# Patient Record
Sex: Male | Born: 1960 | Race: Black or African American | Hispanic: No | Marital: Single | Smoking: Never smoker
Health system: Southern US, Community
[De-identification: ages and names within clinical notes are randomized; demographics above are authoritative.]

## PROBLEM LIST (undated history)

## (undated) DIAGNOSIS — G20 Parkinson's disease: Secondary | ICD-10-CM

## (undated) DIAGNOSIS — F329 Major depressive disorder, single episode, unspecified: Secondary | ICD-10-CM

## (undated) DIAGNOSIS — I10 Essential (primary) hypertension: Secondary | ICD-10-CM

## (undated) DIAGNOSIS — G20A1 Parkinson's disease without dyskinesia, without mention of fluctuations: Secondary | ICD-10-CM

## (undated) DIAGNOSIS — B20 Human immunodeficiency virus [HIV] disease: Secondary | ICD-10-CM

## (undated) DIAGNOSIS — Z21 Asymptomatic human immunodeficiency virus [HIV] infection status: Secondary | ICD-10-CM

## (undated) DIAGNOSIS — E079 Disorder of thyroid, unspecified: Secondary | ICD-10-CM

## (undated) DIAGNOSIS — F32A Depression, unspecified: Secondary | ICD-10-CM

## (undated) DIAGNOSIS — E119 Type 2 diabetes mellitus without complications: Secondary | ICD-10-CM

## (undated) DIAGNOSIS — E0590 Thyrotoxicosis, unspecified without thyrotoxic crisis or storm: Secondary | ICD-10-CM

## (undated) HISTORY — DX: Parkinson's disease: G20

## (undated) HISTORY — PX: THYROIDECTOMY: SHX17

## (undated) HISTORY — PX: TONSILLECTOMY: SUR1361

---
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
## 1980-05-02 HISTORY — PX: OTHER SURGICAL HISTORY: SHX169

## 2003-06-02 ENCOUNTER — Other Ambulatory Visit: Payer: Self-pay

## 2004-04-20 ENCOUNTER — Emergency Department: Payer: Self-pay | Admitting: Emergency Medicine

## 2006-01-06 ENCOUNTER — Emergency Department: Payer: Self-pay | Admitting: Emergency Medicine

## 2009-11-04 ENCOUNTER — Emergency Department: Payer: Self-pay | Admitting: Emergency Medicine

## 2010-12-05 ENCOUNTER — Emergency Department: Payer: Self-pay | Admitting: Emergency Medicine

## 2013-09-26 ENCOUNTER — Emergency Department: Payer: Self-pay | Admitting: Emergency Medicine

## 2016-01-18 ENCOUNTER — Encounter: Payer: Self-pay | Admitting: Emergency Medicine

## 2016-01-18 ENCOUNTER — Emergency Department
Admission: EM | Admit: 2016-01-18 | Discharge: 2016-01-18 | Disposition: A | Discharge status: Home / Self Care | Payer: Managed Care, Other (non HMO) | Attending: Emergency Medicine | Admitting: Emergency Medicine

## 2016-01-18 ENCOUNTER — Emergency Department: Payer: Managed Care, Other (non HMO)

## 2016-01-18 DIAGNOSIS — E119 Type 2 diabetes mellitus without complications: Secondary | ICD-10-CM | POA: Diagnosis not present

## 2016-01-18 DIAGNOSIS — N452 Orchitis: Secondary | ICD-10-CM | POA: Diagnosis not present

## 2016-01-18 DIAGNOSIS — N50819 Testicular pain, unspecified: Secondary | ICD-10-CM

## 2016-01-18 DIAGNOSIS — N50812 Left testicular pain: Secondary | ICD-10-CM | POA: Diagnosis present

## 2016-01-18 HISTORY — DX: Type 2 diabetes mellitus without complications: E11.9

## 2016-01-18 LAB — URINALYSIS COMPLETE WITH MICROSCOPIC (ARMC ONLY)
Bacteria, UA: NONE SEEN
Bilirubin Urine: NEGATIVE
Hgb urine dipstick: NEGATIVE
Leukocytes, UA: NEGATIVE
Nitrite: NEGATIVE
PROTEIN: 30 mg/dL — AB
Specific Gravity, Urine: 1.035 — ABNORMAL HIGH (ref 1.005–1.030)
pH: 6 (ref 5.0–8.0)

## 2016-01-18 MED ORDER — HYDROCODONE-ACETAMINOPHEN 5-325 MG PO TABS: 1.0000 | ORAL_TABLET | ORAL | 0 refills | Status: DC | PRN

## 2016-01-18 MED ORDER — LEVOFLOXACIN 500 MG PO TABS: 500.0000 mg | ORAL_TABLET | Freq: Every day | ORAL | 0 refills | Status: AC

## 2016-01-18 NOTE — ED Provider Notes (Signed)
[facility] Emergency Department Provider Note  Time seen: 8:11 AM  I have reviewed the triage vital signs and the nursing notes.   HISTORY  Chief Complaint Testicle Pain    HPI Christophe LouisOtis Pargas is a 55 y.o. male with a past medical history of diabetes who presents the emergency department left testicular pain. According to the patient for the past 2 days he has noticed intermittent pain in his left testicle. States he noted yesterday that the left testicle appeared very swollen. States continued swelling and pain today along with subjective fever at home so he came to the emergency department for evaluation. Patient denies any dysuria, hematuria. Denies any penile discharge or abdominal pain. Denies any nausea, vomiting, diarrhea or pain with bowel movements. Patient does state mild lower back pain, but is not sure if they are related. Describes the pain as a heavy feeling in the left testicle, moderate in severity, worse when he is standing or ambulating.  Past Medical History:  Diagnosis Date  . Diabetes mellitus without complication (HCC)     There are no active problems to display for this patient.   Past Surgical History:  Procedure Laterality Date  . THYROIDECTOMY    . TONSILLECTOMY      Prior to Admission medications   Not on File    Allergies  Allergen Reactions  . Septra [Sulfamethoxazole-Trimethoprim] Rash    No family history on file.  Social History Social History  Substance Use Topics  . Smoking status: Never Smoker  . Smokeless tobacco: Not on file  . Alcohol use No    Review of Systems Constitutional: Negative for fever. Cardiovascular: Negative for chest pain. Respiratory: Negative for shortness of breath. Gastrointestinal: Negative for abdominal pain, vomiting and diarrhea. Genitourinary: Negative for dysuria.Positive left testicular pain. Musculoskeletal: Mild lower back pain. 10-point ROS otherwise  negative.  ____________________________________________   PHYSICAL EXAM:  VITAL SIGNS: ED Triage Vitals [01/18/16 0603]  Enc Vitals Group     BP      Pulse      Resp      Temp      Temp src      SpO2      Weight 195 lb (88.5 kg)     Height 5\' 9"  (1.753 m)     Head Circumference      Peak Flow      Pain Score 9     Pain Loc      Pain Edu?      Excl. in GC?     Constitutional: Alert and oriented. Well appearing and in no distress. Eyes: Normal exam ENT   Head: Normocephalic and atraumatic.   Mouth/Throat: Mucous membranes are moist. Cardiovascular: Normal rate, regular rhythm. No murmur Respiratory: Normal respiratory effort without tachypnea nor retractions. Breath sounds are clear  Gastrointestinal: Soft and nontender. No distention.  Genitourinary: Patient has moderate swelling of the left scrotum, appears to have swelling above and below the left testicle with moderate tenderness to palpation. Normal right testicle. Normal penis. Musculoskeletal: Nontender with normal range of motion in all extremities.  Neurologic:  Normal speech and language. No gross focal neurologic deficits  Skin:  Skin is warm, dry and intact.  Psychiatric: Mood and affect are normal.   ____________________________________________   RADIOLOGY  Ultrasound shows small left hydrocele with possible degrees possibly infected.  ____________________________________________   INITIAL IMPRESSION / ASSESSMENT AND PLAN / ED COURSE  Pertinent labs & imaging results that were available during my  care of the patient were reviewed by me and considered in my medical decision making (see chart for details).  Patient presents the emergency department with 2 days of left testicular pain and swelling. Patient does have moderate swelling and tenderness to this area on exam. Otherwise normal physical exam. No skin color changes overlying the scrotum. We'll obtain an ultrasound, urinalysis and closely  monitor in the emergency department.  Overall the patient appears well. Ultrasound shows small hydrocele with possible debris, likely infected given the subjective fevers/chills at home with testicular pain. We will cover the patient with Levaquin and have the patient follow up with urology for further workup. Patient agreeable to plan.  ____________________________________________   FINAL CLINICAL IMPRESSION(S) / ED DIAGNOSES  Left testicular pain    Minna Antis, MD 01/18/16 [id number]

## 2016-01-18 NOTE — ED Triage Notes (Signed)
Patient to ED stating he thinks "he has a hernia." States his left testicle is hard, huge and painful. Denies urinary symptoms, states lower back is starting to hurt as well.

## 2016-01-18 NOTE — ED Notes (Signed)
Pt alert and oriented X4, active, cooperative, pt in NAD. RR even and unlabored, color WNL.  Pt informed to return if any life threatening symptoms occur.   

## 2016-01-18 NOTE — ED Notes (Signed)
Pt to US.

## 2018-07-08 ENCOUNTER — Other Ambulatory Visit: Payer: Self-pay

## 2018-07-08 ENCOUNTER — Emergency Department: Payer: No Typology Code available for payment source

## 2018-07-08 ENCOUNTER — Encounter: Payer: Self-pay | Admitting: Radiology

## 2018-07-08 ENCOUNTER — Emergency Department
Admission: EM | Admit: 2018-07-08 | Discharge: 2018-07-08 | Disposition: A | Discharge status: Home / Self Care | Payer: No Typology Code available for payment source | Attending: Emergency Medicine | Admitting: Emergency Medicine

## 2018-07-08 DIAGNOSIS — R51 Headache: Secondary | ICD-10-CM | POA: Diagnosis not present

## 2018-07-08 DIAGNOSIS — B20 Human immunodeficiency virus [HIV] disease: Secondary | ICD-10-CM | POA: Insufficient documentation

## 2018-07-08 DIAGNOSIS — E119 Type 2 diabetes mellitus without complications: Secondary | ICD-10-CM | POA: Insufficient documentation

## 2018-07-08 DIAGNOSIS — M7918 Myalgia, other site: Secondary | ICD-10-CM | POA: Diagnosis not present

## 2018-07-08 DIAGNOSIS — J0100 Acute maxillary sinusitis, unspecified: Secondary | ICD-10-CM | POA: Insufficient documentation

## 2018-07-08 DIAGNOSIS — J189 Pneumonia, unspecified organism: Secondary | ICD-10-CM | POA: Diagnosis not present

## 2018-07-08 DIAGNOSIS — J181 Lobar pneumonia, unspecified organism: Secondary | ICD-10-CM

## 2018-07-08 DIAGNOSIS — R509 Fever, unspecified: Secondary | ICD-10-CM | POA: Diagnosis present

## 2018-07-08 LAB — CBC
HCT: 38.2 % — ABNORMAL LOW (ref 39.0–52.0)
Hemoglobin: 13.2 g/dL (ref 13.0–17.0)
MCH: 32.5 pg (ref 26.0–34.0)
MCHC: 34.6 g/dL (ref 30.0–36.0)
MCV: 94.1 fL (ref 80.0–100.0)
Platelets: 120 10*3/uL — ABNORMAL LOW (ref 150–400)
RBC: 4.06 MIL/uL — ABNORMAL LOW (ref 4.22–5.81)
RDW: 12.6 % (ref 11.5–15.5)
WBC: 18.1 10*3/uL — ABNORMAL HIGH (ref 4.0–10.5)
nRBC: 0 % (ref 0.0–0.2)

## 2018-07-08 LAB — URINALYSIS, ROUTINE W REFLEX MICROSCOPIC
Bacteria, UA: NONE SEEN
Bilirubin Urine: NEGATIVE
Glucose, UA: NEGATIVE mg/dL
Hgb urine dipstick: NEGATIVE
Ketones, ur: NEGATIVE mg/dL
Leukocytes,Ua: NEGATIVE
Nitrite: NEGATIVE
Protein, ur: 30 mg/dL — AB
Specific Gravity, Urine: >1.046 — ABNORMAL HIGH (ref 1.005–1.030)
pH: 5 (ref 5.0–8.0)

## 2018-07-08 LAB — BASIC METABOLIC PANEL
Anion gap: 4 — ABNORMAL LOW (ref 5–15)
BUN: 27 mg/dL — ABNORMAL HIGH (ref 6–20)
CO2: 27 mmol/L (ref 22–32)
CREATININE: 1.68 mg/dL — AB (ref 0.61–1.24)
Calcium: 8.4 mg/dL — ABNORMAL LOW (ref 8.9–10.3)
Chloride: 104 mmol/L (ref 98–111)
GFR calc Af Amer: 51 mL/min — ABNORMAL LOW (ref >60)
GFR calc non Af Amer: 44 mL/min — ABNORMAL LOW (ref >60)
GLUCOSE: 140 mg/dL — AB (ref 70–99)
Potassium: 4 mmol/L (ref 3.5–5.1)
Sodium: 135 mmol/L (ref 135–145)

## 2018-07-08 LAB — INFLUENZA PANEL BY PCR (TYPE A & B)
INFLAPCR: NEGATIVE
Influenza B By PCR: NEGATIVE

## 2018-07-08 LAB — GLUCOSE, CAPILLARY: Glucose-Capillary: 126 mg/dL — ABNORMAL HIGH (ref 70–99)

## 2018-07-08 LAB — LACTIC ACID, PLASMA: Lactic Acid, Venous: 1.8 mmol/L (ref 0.5–1.9)

## 2018-07-08 MED ORDER — IBUPROFEN 800 MG PO TABS
800.0000 mg | ORAL_TABLET | Freq: Once | ORAL | Status: AC
  Administered 2018-07-08: 800 mg via ORAL
  Filled 2018-07-08: qty 1

## 2018-07-08 MED ORDER — AMOXICILLIN-POT CLAVULANATE 875-125 MG PO TABS
1.0000 | ORAL_TABLET | Freq: Once | ORAL | Status: AC
  Administered 2018-07-08: 1 via ORAL
  Filled 2018-07-08: qty 1

## 2018-07-08 MED ORDER — IOHEXOL 300 MG/ML  SOLN
75.0000 mL | Freq: Once | INTRAMUSCULAR | Status: AC | PRN
  Administered 2018-07-08: 75 mL via INTRAVENOUS
  Filled 2018-07-08: qty 75

## 2018-07-08 MED ORDER — SODIUM CHLORIDE 0.9 % IV BOLUS
1000.0000 mL | Freq: Once | INTRAVENOUS | Status: AC
  Administered 2018-07-08: 1000 mL via INTRAVENOUS

## 2018-07-08 MED ORDER — ACETAMINOPHEN 500 MG PO TABS
1000.0000 mg | ORAL_TABLET | Freq: Once | ORAL | Status: AC
  Administered 2018-07-08: 1000 mg via ORAL
  Filled 2018-07-08: qty 2

## 2018-07-08 MED ORDER — AZITHROMYCIN 250 MG PO TABS: ORAL_TABLET | ORAL | 0 refills | Status: DC

## 2018-07-08 MED ORDER — AZITHROMYCIN 500 MG PO TABS
500.0000 mg | ORAL_TABLET | Freq: Once | ORAL | Status: AC
  Administered 2018-07-08: 500 mg via ORAL
  Filled 2018-07-08: qty 1

## 2018-07-08 MED ORDER — SODIUM CHLORIDE 0.9 % IV SOLN
1.0000 g | Freq: Once | INTRAVENOUS | Status: AC
  Administered 2018-07-08: 1 g via INTRAVENOUS
  Filled 2018-07-08: qty 10

## 2018-07-08 MED ORDER — AMOXICILLIN-POT CLAVULANATE 875-125 MG PO TABS: 1.0000 | ORAL_TABLET | Freq: Two times a day (BID) | ORAL | 0 refills | Status: AC

## 2018-07-08 NOTE — ED Notes (Signed)
Left AC IV came out and was d/c. New IV started with set of blood cultures.

## 2018-07-08 NOTE — Discharge Instructions (Signed)
Please take antibiotics as prescribed.  Please take Tylenol as needed for fevers.  Make sure drinking lots of fluids.  If any shortness of breath, fevers above 102.1, chest pain, return to the emergency department immediately.

## 2018-07-08 NOTE — ED Notes (Signed)
Pt noted to be clammy, discussion about pt's temp being lower vs blood sugar dropping, pt states that he didn't think it was his blood sugar because he wouldn't be talking to me if it were low, fsbs performed at 126

## 2018-07-08 NOTE — ED Provider Notes (Signed)
[facility] EMERGENCY DEPARTMENT Provider Note   CSN: [id number] Arrival date & time: 07/08/18  1246    History   Chief Complaint Chief Complaint  Patient presents with  . Fever  . Facial Pain  . Generalized Body Aches    HPI Jeffrey Pearson is a 58 y.o. male.  With a history of HIV and diabetes who is followed by [facility] presents to the emergency department for evaluation of fever, facial pain and body aches.  Patient states around 4 weeks ago he developed cold symptoms consisting of cough congestion runny nose.  2 weeks ago his cough resolved but he continued to have soreness congestion and sinus pressure.  Sinus pain and pressure is 8 out of 10.  This morning he noticed a fever upon awakening and took Tylenol.  He has not taken ibuprofen today.  He denies any cough but is having moderate maxillary facial pain.  No sore throat.  No headache, neck pain, chest pain or shortness of breath.  No abdominal pain nausea vomiting or diarrhea.  He does admit to having generalized body aches.  Patient denies any recent travel.     HPI  Past Medical History:  Diagnosis Date  . Diabetes mellitus without complication (HCC)     There are no active problems to display for this patient.   Past Surgical History:  Procedure Laterality Date  . THYROIDECTOMY    . TONSILLECTOMY          Home Medications    Prior to Admission medications   Medication Sig Start Date End Date Taking? Authorizing Provider  HYDROcodone-acetaminophen (NORCO/VICODIN) 5-325 MG tablet Take 1 tablet by mouth every 4 (four) hours as needed. 01/18/16   Minna Antis, MD    Family History No family history on file.  Social History Social History   Tobacco Use  . Smoking status: Never Smoker  Substance Use Topics  . Alcohol use: No  . Drug use: Not on file     Allergies   Septra [sulfamethoxazole-trimethoprim]   Review of Systems Review of Systems  Constitutional: Negative for  fever.  HENT: Positive for congestion, rhinorrhea, sinus pressure, sinus pain and sore throat.   Respiratory: Negative for cough, wheezing and stridor.   Gastrointestinal: Negative for diarrhea, nausea and vomiting.  Musculoskeletal: Positive for myalgias. Negative for arthralgias and neck stiffness.  Skin: Negative for rash and wound.  Neurological: Negative for dizziness.     Physical Exam Updated Vital Signs BP 100/61 (BP Location: Right Arm)   Pulse 87   Temp 98 F (36.7 C) (Oral)   Resp 16   Ht  (1.753 m)   Wt 98.9 kg   SpO2 100%   BMI 32.19 kg/m   Physical Exam Constitutional:      General: He is not in acute distress.    Appearance: He is well-developed.  HENT:     Head: Normocephalic and atraumatic.     Jaw: No trismus.     Comments: Mild bilateral maxillary and frontal sinus tenderness to palpation.    Right Ear: Hearing, ear canal and external ear normal. There is impacted cerumen.     Left Ear: Hearing, tympanic membrane, ear canal and external ear normal. There is no impacted cerumen.     Nose: Rhinorrhea present.     Right Sinus: No maxillary sinus tenderness or frontal sinus tenderness.     Left Sinus: No maxillary sinus tenderness or frontal sinus tenderness.     Mouth/Throat:  Pharynx: No oropharyngeal exudate, posterior oropharyngeal erythema or uvula swelling.     Tonsils: No tonsillar abscesses.  Eyes:     Conjunctiva/sclera: Conjunctivae normal.  Neck:     Musculoskeletal: Normal range of motion.  Cardiovascular:     Rate and Rhythm: Regular rhythm. Tachycardia present.     Pulses: Normal pulses.  Pulmonary:     Effort: Pulmonary effort is normal. No respiratory distress.     Breath sounds: Normal breath sounds. No stridor. No wheezing or rales.  Chest:     Chest wall: No tenderness.  Abdominal:     General: There is no distension.     Palpations: Abdomen is soft.     Tenderness: There is no abdominal tenderness. There is no guarding.    Musculoskeletal: Normal range of motion.        General: No swelling.  Skin:    General: Skin is warm and dry.     Findings: No rash.  Neurological:     Mental Status: He is alert and oriented to person, place, and time.  Psychiatric:        Behavior: Behavior normal.        Thought Content: Thought content normal.        Judgment: Judgment normal.      ED Treatments / Results  Labs (all labs ordered are listed, but only abnormal results are displayed) Labs Reviewed  CBC - Abnormal; Notable for the following components:      Result Value   WBC 18.1 (*)    RBC 4.06 (*)    HCT 38.2 (*)    Platelets 120 (*)    All other components within normal limits  BASIC METABOLIC PANEL - Abnormal; Notable for the following components:   Glucose, Bld 140 (*)    BUN 27 (*)    Creatinine, Ser 1.68 (*)    Calcium 8.4 (*)    GFR calc non Af Amer 44 (*)    GFR calc Af Amer 51 (*)    Anion gap 4 (*)    All other components within normal limits  GLUCOSE, CAPILLARY - Abnormal; Notable for the following components:   Glucose-Capillary 126 (*)    All other components within normal limits  URINALYSIS, ROUTINE W REFLEX MICROSCOPIC - Abnormal; Notable for the following components:   Color, Urine YELLOW (*)    APPearance CLEAR (*)    Specific Gravity, Urine >1.046 (*)    Protein, ur 30 (*)    All other components within normal limits  CULTURE, BLOOD (ROUTINE X 2)  CULTURE, BLOOD (ROUTINE X 2)  INFLUENZA PANEL BY PCR (TYPE A & B)  LACTIC ACID, PLASMA    EKG None  Radiology Dg Chest 2 View  Result Date: 07/08/2018 CLINICAL DATA:  Fever EXAM: CHEST - 2 VIEW COMPARISON:  12/05/2010 chest radiograph. FINDINGS: Surgical clips overlie the medial lower neck bilaterally. Stable cardiomediastinal silhouette with normal heart size. No pneumothorax. No pleural effusion. Masslike 6 cm focus of consolidation in the right upper lung is new. IMPRESSION: New masslike 6 cm focus of consolidation in the  right upper lung. Differential includes lung neoplasm or round pneumonia. Recommend further characterization with chest CT with IV contrast. Electronically Signed   By: Delbert Phenix M.D.   On: 07/08/2018 14:34   Ct Chest W Contrast  Result Date: 07/08/2018 CLINICAL DATA:  Rounded opacity in the right upper lobe seen on chest x-ray from earlier today could represent neoplasm versus pneumonia. CT scan  was recommended. EXAM: CT CHEST WITH CONTRAST TECHNIQUE: Multidetector CT imaging of the chest was performed during intravenous contrast administration. CONTRAST:  75mL OMNIPAQUE IOHEXOL 300 MG/ML  SOLN COMPARISON:  Chest x-ray July 08, 2018 FINDINGS: Cardiovascular: Atherosclerotic changes are seen in the nonaneurysmal aorta. Central pulmonary arteries are normal. Coronary artery calcifications involve the right and left coronary arteries. The heart size borderline. Mediastinum/Nodes: There is a high attenuation mass measuring 15 x 9 mm in the anterior mediastinum on series 2, image 31. No other suspicious masses in the mediastinum. No effusions. Surgical clips in the thyroid bed consistent with history of thyroidectomy. There does appear to be some residual thyroid tissue. Mildly prominent axillary nodes may be reactive. Lungs/Pleura: Central airways are normal. No pneumothorax. There is rounded opacity in the posterior right upper lobe with air bronchograms. The opacity measures 6.4 by 5.6 by 5.7 cm. No other nodules, masses, or focal infiltrates identified. Upper Abdomen: No acute abnormality. Musculoskeletal: Anterior wedging of 2 or 3 midthoracic vertebral bodies. Mild degenerative changes. IMPRESSION: 1. Rounded opacity in the right upper lobe favored to represent pneumonia. Neoplasm considered less likely but not completely excluded. Recommend treatment with antibiotics and a follow-up chest CT in 6-8 weeks to ensure resolution. 2. 15 x 9 mm high attenuation mass in the anterior mediastinum may represent a  hyperenhancing lymph node. Thyroid tissue is a possibility as is a nodule of thymic origin. Recommend attention on follow-up. 3. Atherosclerotic changes in the nonaneurysmal aorta. 4. Coronary artery calcifications. 5. Mildly prominent bilateral axillary nodes, likely reactive. Recommend attention on follow-up. Aortic Atherosclerosis (ICD10-I70.0). Electronically Signed   By: Gerome Sam III M.D   On: 07/08/2018 17:20    Procedures Procedures (including critical care time)  Medications Ordered in ED Medications  cefTRIAXone (ROCEPHIN) 1 g in sodium chloride 0.9 % 100 mL IVPB (1 g Intravenous New Bag/Given 07/08/18 1840)  ibuprofen (ADVIL,MOTRIN) tablet 800 mg (800 mg Oral Given 07/08/18 1414)  acetaminophen (TYLENOL) tablet 1,000 mg (1,000 mg Oral Given 07/08/18 1546)  iohexol (OMNIPAQUE) 300 MG/ML solution 75 mL (75 mLs Intravenous Contrast Given 07/08/18 1628)  sodium chloride 0.9 % bolus 1,000 mL (1,000 mLs Intravenous Bolus 07/08/18 1838)     Initial Impression / Assessment and Plan / ED Course  I have reviewed the triage vital signs and the nursing notes.  Pertinent labs & imaging results that were available during my care of the patient were reviewed by me and considered in my medical decision making (see chart for details).        58 year old male with history of HIV and diabetes mellitus presents to the emergency department for evaluation of fever.  Has had cold symptoms for 4 weeks.  Mostly complaining of maxillary sinus pain and pressure concerning for sinusitis.  Chest x-ray showed right upper lobe consolidation.  CT of chest with contrast ordered and showed large opacity in the right upper lobe concerning most likely for pneumonia.  Patient given Tylenol and ibuprofen, vital signs improved, afebrile.  Patient given 1 g of ceftriaxone IV.  Patient's lactic acid within normal limits.  He was given IV fluids.  CBC and BMP showed leukocytosis of 18.1.  Patient's vital signs normal at time  of discharge.  He was afebrile with normal BP, temp heart rate and O2 saturations.  Patient appears well in no distress.  He has follow-up appointment this week on Thursday, 07/12/2018 with PCP at [facility].  He understands signs symptoms return to the ED for.  Patient stable and ready for discharge home.  Final Clinical Impressions(s) / ED Diagnoses   Final diagnoses:  Fever, unspecified fever cause  Acute non-recurrent maxillary sinusitis  Pneumonia of right upper lobe due to infectious organism [facility])    ED Discharge Orders    None       Ronnette Juniper 07/08/18 Deatra Robinson, MD 07/09/18 [phone]

## 2018-07-08 NOTE — ED Notes (Signed)
Pt reports fever, chills, and fatigue that started this week. Pt reports hx/o chronic sinus infections. Pt states that his temperature this morning was 102. Pt took tylenol around 0600

## 2018-07-08 NOTE — ED Notes (Addendum)
Mother called. Will pass on message.

## 2018-07-08 NOTE — ED Triage Notes (Signed)
Pt states that he hasn't felt well for 3-4 weeks with a cough and congestion, but states since Thursday he has had a fever, states facial pain, headache, and body aches. States that he thinks he has a sinus infection

## 2018-07-13 LAB — CULTURE, BLOOD (ROUTINE X 2)
Culture: NO GROWTH
Culture: NO GROWTH
Special Requests: ADEQUATE

## 2018-08-23 ENCOUNTER — Emergency Department: Payer: No Typology Code available for payment source

## 2018-08-23 ENCOUNTER — Encounter: Payer: Self-pay | Admitting: Intensive Care

## 2018-08-23 ENCOUNTER — Other Ambulatory Visit: Payer: Self-pay

## 2018-08-23 ENCOUNTER — Inpatient Hospital Stay
Admission: EM | Admit: 2018-08-23 | Discharge: 2018-08-25 | DRG: 638 | Discharge status: Against Medical Advice | Payer: No Typology Code available for payment source | Attending: Internal Medicine | Admitting: Internal Medicine

## 2018-08-23 DIAGNOSIS — E890 Postprocedural hypothyroidism: Secondary | ICD-10-CM | POA: Diagnosis present

## 2018-08-23 DIAGNOSIS — Z9114 Patient's other noncompliance with medication regimen: Secondary | ICD-10-CM | POA: Diagnosis not present

## 2018-08-23 DIAGNOSIS — Z888 Allergy status to other drugs, medicaments and biological substances status: Secondary | ICD-10-CM

## 2018-08-23 DIAGNOSIS — Z21 Asymptomatic human immunodeficiency virus [HIV] infection status: Secondary | ICD-10-CM | POA: Diagnosis present

## 2018-08-23 DIAGNOSIS — E0590 Thyrotoxicosis, unspecified without thyrotoxic crisis or storm: Secondary | ICD-10-CM | POA: Diagnosis present

## 2018-08-23 DIAGNOSIS — I10 Essential (primary) hypertension: Secondary | ICD-10-CM | POA: Diagnosis present

## 2018-08-23 DIAGNOSIS — R001 Bradycardia, unspecified: Secondary | ICD-10-CM | POA: Diagnosis present

## 2018-08-23 DIAGNOSIS — I442 Atrioventricular block, complete: Secondary | ICD-10-CM | POA: Diagnosis present

## 2018-08-23 DIAGNOSIS — E860 Dehydration: Secondary | ICD-10-CM | POA: Diagnosis present

## 2018-08-23 DIAGNOSIS — G9340 Encephalopathy, unspecified: Secondary | ICD-10-CM | POA: Diagnosis present

## 2018-08-23 DIAGNOSIS — Z8249 Family history of ischemic heart disease and other diseases of the circulatory system: Secondary | ICD-10-CM | POA: Diagnosis not present

## 2018-08-23 DIAGNOSIS — R740 Nonspecific elevation of levels of transaminase and lactic acid dehydrogenase [LDH]: Secondary | ICD-10-CM | POA: Diagnosis present

## 2018-08-23 DIAGNOSIS — N179 Acute kidney failure, unspecified: Secondary | ICD-10-CM | POA: Diagnosis present

## 2018-08-23 DIAGNOSIS — E1101 Type 2 diabetes mellitus with hyperosmolarity with coma: Secondary | ICD-10-CM | POA: Diagnosis present

## 2018-08-23 DIAGNOSIS — E1065 Type 1 diabetes mellitus with hyperglycemia: Principal | ICD-10-CM | POA: Diagnosis present

## 2018-08-23 DIAGNOSIS — Z79891 Long term (current) use of opiate analgesic: Secondary | ICD-10-CM | POA: Diagnosis not present

## 2018-08-23 DIAGNOSIS — E785 Hyperlipidemia, unspecified: Secondary | ICD-10-CM | POA: Diagnosis present

## 2018-08-23 DIAGNOSIS — R739 Hyperglycemia, unspecified: Secondary | ICD-10-CM

## 2018-08-23 DIAGNOSIS — R569 Unspecified convulsions: Secondary | ICD-10-CM | POA: Diagnosis present

## 2018-08-23 HISTORY — DX: Disorder of thyroid, unspecified: E07.9

## 2018-08-23 HISTORY — DX: Thyrotoxicosis, unspecified without thyrotoxic crisis or storm: E05.90

## 2018-08-23 HISTORY — DX: Asymptomatic human immunodeficiency virus (hiv) infection status: Z21

## 2018-08-23 HISTORY — DX: Essential (primary) hypertension: I10

## 2018-08-23 HISTORY — DX: Human immunodeficiency virus (HIV) disease: B20

## 2018-08-23 HISTORY — DX: Major depressive disorder, single episode, unspecified: F32.9

## 2018-08-23 HISTORY — DX: Depression, unspecified: F32.A

## 2018-08-23 LAB — URINALYSIS, COMPLETE (UACMP) WITH MICROSCOPIC
Bacteria, UA: NONE SEEN
Bilirubin Urine: NEGATIVE
Glucose, UA: >=500 mg/dL — AB
Hgb urine dipstick: NEGATIVE
Ketones, ur: NEGATIVE mg/dL
Leukocytes,Ua: NEGATIVE
Nitrite: NEGATIVE
Protein, ur: NEGATIVE mg/dL
Specific Gravity, Urine: 1.021 (ref 1.005–1.030)
Squamous Epithelial / HPF: NONE SEEN (ref 0–5)
pH: 7 (ref 5.0–8.0)

## 2018-08-23 LAB — COMPREHENSIVE METABOLIC PANEL
ALT: 62 U/L — ABNORMAL HIGH (ref 0–44)
AST: 57 U/L — ABNORMAL HIGH (ref 15–41)
Albumin: 4.1 g/dL (ref 3.5–5.0)
Alkaline Phosphatase: 183 U/L — ABNORMAL HIGH (ref 38–126)
Anion gap: 14 (ref 5–15)
BUN: 21 mg/dL — ABNORMAL HIGH (ref 6–20)
CO2: 23 mmol/L (ref 22–32)
Calcium: 8.9 mg/dL (ref 8.9–10.3)
Chloride: 85 mmol/L — ABNORMAL LOW (ref 98–111)
Creatinine, Ser: 1.27 mg/dL — ABNORMAL HIGH (ref 0.61–1.24)
GFR calc Af Amer: >60 mL/min (ref >60)
GFR calc non Af Amer: >60 mL/min (ref >60)
Glucose, Bld: 788 mg/dL (ref 70–99)
Potassium: 5.2 mmol/L — ABNORMAL HIGH (ref 3.5–5.1)
Sodium: 122 mmol/L — ABNORMAL LOW (ref 135–145)
Total Bilirubin: 0.8 mg/dL (ref 0.3–1.2)
Total Protein: 8 g/dL (ref 6.5–8.1)

## 2018-08-23 LAB — CBC WITH DIFFERENTIAL/PLATELET
Abs Immature Granulocytes: 0.01 10*3/uL (ref 0.00–0.07)
Basophils Absolute: 0 10*3/uL (ref 0.0–0.1)
Basophils Relative: 0 %
Eosinophils Absolute: 0 10*3/uL (ref 0.0–0.5)
Eosinophils Relative: 1 %
HCT: 38.1 % — ABNORMAL LOW (ref 39.0–52.0)
Hemoglobin: 13.2 g/dL (ref 13.0–17.0)
Immature Granulocytes: 0 %
Lymphocytes Relative: 12 %
Lymphs Abs: 0.6 10*3/uL — ABNORMAL LOW (ref 0.7–4.0)
MCH: 32.3 pg (ref 26.0–34.0)
MCHC: 34.6 g/dL (ref 30.0–36.0)
MCV: 93.2 fL (ref 80.0–100.0)
Monocytes Absolute: 0.2 10*3/uL (ref 0.1–1.0)
Monocytes Relative: 3 %
Neutro Abs: 3.9 10*3/uL (ref 1.7–7.7)
Neutrophils Relative %: 84 %
Platelets: 113 10*3/uL — ABNORMAL LOW (ref 150–400)
RBC: 4.09 MIL/uL — ABNORMAL LOW (ref 4.22–5.81)
RDW: 11.9 % (ref 11.5–15.5)
WBC: 4.7 10*3/uL (ref 4.0–10.5)
nRBC: 0 % (ref 0.0–0.2)

## 2018-08-23 LAB — GLUCOSE, CAPILLARY
Glucose-Capillary: 392 mg/dL — ABNORMAL HIGH (ref 70–99)
Glucose-Capillary: 488 mg/dL — ABNORMAL HIGH (ref 70–99)
Glucose-Capillary: 533 mg/dL (ref 70–99)

## 2018-08-23 LAB — MAGNESIUM: Magnesium: 1.9 mg/dL (ref 1.7–2.4)

## 2018-08-23 LAB — OSMOLALITY: Osmolality: 304 mOsm/kg — ABNORMAL HIGH (ref 275–295)

## 2018-08-23 LAB — MRSA PCR SCREENING: MRSA by PCR: NEGATIVE

## 2018-08-23 LAB — TROPONIN I: Troponin I: <0.03 ng/mL (ref <0.03)

## 2018-08-23 MED ORDER — ATORVASTATIN CALCIUM 10 MG PO TABS
10.0000 mg | ORAL_TABLET | Freq: Every day | ORAL | Status: DC
  Administered 2018-08-24: 10 mg via ORAL
  Filled 2018-08-23: qty 1

## 2018-08-23 MED ORDER — INSULIN REGULAR(HUMAN) IN NACL 100-0.9 UT/100ML-% IV SOLN: INTRAVENOUS | Status: DC

## 2018-08-23 MED ORDER — SODIUM CHLORIDE 0.9 % IV BOLUS
1000.0000 mL | Freq: Once | INTRAVENOUS | Status: AC
  Administered 2018-08-23: 1000 mL via INTRAVENOUS

## 2018-08-23 MED ORDER — ATORVASTATIN CALCIUM 10 MG PO TABS: 10.0000 mg | ORAL_TABLET | Freq: Every day | ORAL | Status: DC

## 2018-08-23 MED ORDER — IOHEXOL 300 MG/ML  SOLN
75.0000 mL | Freq: Once | INTRAMUSCULAR | Status: AC | PRN
  Administered 2018-08-23: 19:00:00 75 mL via INTRAVENOUS

## 2018-08-23 MED ORDER — DEXTROSE-NACL 5-0.45 % IV SOLN: INTRAVENOUS | Status: DC

## 2018-08-23 MED ORDER — ENOXAPARIN SODIUM 40 MG/0.4ML ~~LOC~~ SOLN
40.0000 mg | SUBCUTANEOUS | Status: DC
  Administered 2018-08-23 – 2018-08-24 (×2): 40 mg via SUBCUTANEOUS
  Filled 2018-08-23 (×2): qty 0.4

## 2018-08-23 MED ORDER — ACETAMINOPHEN 500 MG PO TABS
1000.0000 mg | ORAL_TABLET | Freq: Once | ORAL | Status: DC
  Filled 2018-08-23: qty 2

## 2018-08-23 MED ORDER — DULOXETINE HCL 30 MG PO CPEP
30.0000 mg | ORAL_CAPSULE | Freq: Every day | ORAL | Status: DC
  Administered 2018-08-24 – 2018-08-25 (×2): 30 mg via ORAL
  Filled 2018-08-23 (×2): qty 1

## 2018-08-23 MED ORDER — INSULIN REGULAR(HUMAN) IN NACL 100-0.9 UT/100ML-% IV SOLN
INTRAVENOUS | Status: DC
  Administered 2018-08-23: 3.3 [IU]/h via INTRAVENOUS
  Administered 2018-08-23: 23:00:00 4.7 [IU]/h via INTRAVENOUS
  Filled 2018-08-23: qty 100

## 2018-08-23 MED ORDER — VALACYCLOVIR HCL 500 MG PO TABS
500.0000 mg | ORAL_TABLET | Freq: Every day | ORAL | Status: DC
  Administered 2018-08-24 – 2018-08-25 (×2): 500 mg via ORAL
  Filled 2018-08-23 (×2): qty 1

## 2018-08-23 MED ORDER — LORAZEPAM 2 MG/ML IJ SOLN: 1.0000 mg | INTRAMUSCULAR | Status: DC | PRN

## 2018-08-23 MED ORDER — DEXTROSE-NACL 5-0.45 % IV SOLN
INTRAVENOUS | Status: DC
  Administered 2018-08-24: 03:00:00 via INTRAVENOUS

## 2018-08-23 MED ORDER — PROCHLORPERAZINE EDISYLATE 10 MG/2ML IJ SOLN
10.0000 mg | Freq: Once | INTRAMUSCULAR | Status: DC
  Filled 2018-08-23: qty 2

## 2018-08-23 MED ORDER — LORAZEPAM 2 MG/ML IJ SOLN
1.0000 mg | Freq: Once | INTRAMUSCULAR | Status: AC
  Administered 2018-08-23: 1 mg via INTRAVENOUS

## 2018-08-23 MED ORDER — BICTEGRAVIR-EMTRICITAB-TENOFOV 50-200-25 MG PO TABS
1.0000 | ORAL_TABLET | Freq: Every day | ORAL | Status: DC
  Administered 2018-08-24 – 2018-08-25 (×2): 1 via ORAL
  Filled 2018-08-23 (×2): qty 1

## 2018-08-23 MED ORDER — HYDRALAZINE HCL 20 MG/ML IJ SOLN: 5.0000 mg | INTRAMUSCULAR | Status: DC | PRN

## 2018-08-23 MED ORDER — LEVOTHYROXINE SODIUM 50 MCG PO TABS
75.0000 ug | ORAL_TABLET | Freq: Every day | ORAL | Status: DC
  Administered 2018-08-24 – 2018-08-25 (×2): 75 ug via ORAL
  Filled 2018-08-23: qty 2
  Filled 2018-08-23: qty 1

## 2018-08-23 MED ORDER — LORAZEPAM 2 MG/ML IJ SOLN
INTRAMUSCULAR | Status: AC
  Filled 2018-08-23: qty 1

## 2018-08-23 MED ORDER — SODIUM CHLORIDE 0.9 % IV SOLN
INTRAVENOUS | Status: AC
  Administered 2018-08-23 (×2): via INTRAVENOUS

## 2018-08-23 MED ORDER — SODIUM CHLORIDE 0.9 % IV SOLN
INTRAVENOUS | Status: DC
  Administered 2018-08-24: 01:00:00 via INTRAVENOUS

## 2018-08-23 MED ORDER — DIPHENHYDRAMINE HCL 50 MG/ML IJ SOLN
12.5000 mg | Freq: Once | INTRAMUSCULAR | Status: DC
  Filled 2018-08-23: qty 1

## 2018-08-23 NOTE — ED Notes (Signed)
sats decreased to 88 after ativan, placed on 2 L . Dr Roxan Hockey aware.

## 2018-08-23 NOTE — ED Notes (Signed)
Pt given his cell phone to update his family. Pt seems to fall asleep while using phone. When pt awaken, pt calls family and updated them.

## 2018-08-23 NOTE — ED Notes (Signed)
Pt brady down to low 20s. Still responsive. Rhythm strip placed in chart. Pads placed on pt. Dr Roxan Hockey aware. Pt returned to [facility].

## 2018-08-23 NOTE — ED Notes (Signed)
Dr Roxan Hockey at bedside. Pt having tremors and parts of body having mild shaking but pt still responding and following commands/answering questions.

## 2018-08-23 NOTE — H&P (Addendum)
[facility] [place] at [facility]   PATIENT NAME: Jeffrey Pearson    MR#:  [id number]  DATE OF BIRTH:  Mar 29, 1961  DATE OF ADMISSION:  08/23/2018  PRIMARY CARE PHYSICIAN: Patient, No Pcp Per   REQUESTING/REFERRING PHYSICIAN: Willy Eddy, MD  CHIEF COMPLAINT:   Chief Complaint  Patient presents with  . Migraine  . Weakness  . Torticollis    HISTORY OF PRESENT ILLNESS:  Jeffrey Pearson  is a 58 y.o. male with a known history of type 2 diabetes, HIV, hypothyroidism s/p thyroidectomy who presented to the ED with headaches and generalized weakness over the last 1 to 2 weeks.  He states he "just has not been feeling like himself".  He has been taking his insulin as prescribed.  He has not checked his blood sugar in 2 to 3 weeks.  While in triage, he was noted to have an episode where his eyes were moving back and forth.  This episode lasted about 30 seconds.  He was also noted to possibly have some movement of his head to the right side.  On arrival to the ED, he was noted to be bradycardic to the low 20s, likely in complete heart block.  He then reverted back to normal sinus rhythm spontaneously without any intervention.  Patient states he often becomes a little disoriented with slurred speech whenever his blood sugars got really low.  After his initial episode of bradycardia, heart rates were in the 80s to 90s.  Abs were significant for glucose 788, creatinine 1.27, normal anion gap.  UA was unremarkable.  Chest x-ray is unremarkable.  CT head with and without contrast were unremarkable.  Patient was started on insulin drip.  Hospitalists were called for admission.  PAST MEDICAL HISTORY:   Past Medical History:  Diagnosis Date  . Diabetes mellitus without complication (HCC)   . HIV (human immunodeficiency virus infection) (HCC)   . Hyperthyroidism   . Thyroid disease     PAST SURGICAL HISTORY:   Past Surgical History:  Procedure Laterality Date  .  THYROIDECTOMY    . TONSILLECTOMY      SOCIAL HISTORY:   Social History   Tobacco Use  . Smoking status: Never Smoker  . Smokeless tobacco: Never Used  Substance Use Topics  . Alcohol use: No    FAMILY HISTORY:  Maternal grandfather-heart disease  DRUG ALLERGIES:   Allergies  Allergen Reactions  . Septra [Sulfamethoxazole-Trimethoprim] Rash    REVIEW OF SYSTEMS:   Review of Systems  Constitutional: Positive for malaise/fatigue. Negative for chills and fever.  HENT: Negative for congestion and sore throat.   Eyes: Negative for blurred vision and double vision.  Respiratory: Negative for cough and shortness of breath.   Cardiovascular: Negative for chest pain and palpitations.  Gastrointestinal: Negative for nausea and vomiting.  Genitourinary: Negative for dysuria and urgency.  Musculoskeletal: Negative for back pain and neck pain.  Neurological: Positive for weakness. Negative for dizziness and headaches.  Psychiatric/Behavioral: Negative for depression. The patient is not nervous/anxious.     MEDICATIONS AT HOME:   Prior to Admission medications   Medication Sig Start Date End Date Taking? Authorizing Provider  azithromycin (ZITHROMAX Z-PAK) 250 MG tablet Take 2 tablets (500 mg) on  Day 1 and day 2,  followed by 1 tablet (250 mg) once daily on Days 3 through 7. 07/08/18   Evon Slack, PA-C  HYDROcodone-acetaminophen (NORCO/VICODIN) 5-325 MG tablet Take 1 tablet by mouth every 4 (four) hours  as needed. 01/18/16   Minna Antis, MD      VITAL SIGNS:  Blood pressure (!) 148/110, pulse 80, temperature 99.3 F (37.4 C), temperature source Oral, resp. rate (!) 26, height 5' 9.5" (1.765 m), weight 95.3 kg, SpO2 99 %.  PHYSICAL EXAMINATION:  Physical Exam  GENERAL:  58 y.o.-year-old patient lying in the bed with no acute distress.  EYES: Pupils equal, round, reactive to light and accommodation. No scleral icterus. Extraocular muscles intact.  HEENT: Head  atraumatic, normocephalic. Oropharynx and nasopharynx clear. + Dry mucous membranes NECK:  Supple, no jugular venous distention. No thyroid enlargement, no tenderness. +well-healed horizontal surgical incision on the anterior neck LUNGS: Normal breath sounds bilaterally, no wheezing, rales,rhonchi or crepitation. No use of accessory muscles of respiration.  CARDIOVASCULAR: RRR, S1, S2 normal. No rubs, or gallops. II/VI systolic murmur ABDOMEN: Soft, nontender, nondistended. Bowel sounds present. No organomegaly or mass.  EXTREMITIES: No pedal edema, cyanosis, or clubbing.  NEUROLOGIC: Cranial nerves II through XII are intact. Muscle strength 5/5 in all extremities. Sensation intact. Gait not checked.  PSYCHIATRIC: The patient is alert and oriented x 3.  SKIN: No obvious rash, lesion, or ulcer.   LABORATORY PANEL:   CBC Recent Labs  Lab 08/23/18 1829  WBC 4.7  HGB 13.2  HCT 38.1*  PLT 113*   ------------------------------------------------------------------------------------------------------------------  Chemistries  Recent Labs  Lab 08/23/18 1829 08/23/18 1833  NA 122*  --   K 5.2*  --   CL 85*  --   CO2 23  --   GLUCOSE 788*  --   BUN 21*  --   CREATININE 1.27*  --   CALCIUM 8.9  --   MG  --  1.9  AST 57*  --   ALT 62*  --   ALKPHOS 183*  --   BILITOT 0.8  --    ------------------------------------------------------------------------------------------------------------------  Cardiac Enzymes Recent Labs  Lab 08/23/18 1833  TROPONINI <0.03   ------------------------------------------------------------------------------------------------------------------  RADIOLOGY:  Ct Head W Or Wo Contrast  Result Date: 08/23/2018 CLINICAL DATA:  Altered mental status EXAM: CT HEAD WITHOUT AND WITH CONTRAST TECHNIQUE: Contiguous axial images were obtained from the base of the skull through the vertex without and with intravenous contrast CONTRAST:  84mL OMNIPAQUE IOHEXOL 300  MG/ML  SOLN COMPARISON:  None. FINDINGS: Brain: There is no mass, hemorrhage or extra-axial collection. The size and configuration of the ventricles and extra-axial CSF spaces are normal. There is hypoattenuation of the white matter, most commonly indicating chronic small vessel disease. No abnormal contrast enhancement. Vascular: No abnormal hyperdensity of the major intracranial arteries or dural venous sinuses. No intracranial atherosclerosis. Skull: The visualized skull base, calvarium and extracranial soft tissues are normal. Sinuses/Orbits: No fluid levels or advanced mucosal thickening of the visualized paranasal sinuses. No mastoid or middle ear effusion. The orbits are normal. IMPRESSION: 1. No enhancing intracranial lesions. 2. Chronic microvascular ischemic changes of the white matter. 3. No acute intracranial abnormality. Electronically Signed   By: Deatra Robinson M.D.   On: 08/23/2018 19:35   Dg Chest Portable 1 View  Result Date: 08/23/2018 CLINICAL DATA:  Altered mental status. EXAM: PORTABLE CHEST 1 VIEW COMPARISON:  Chest radiograph 07/08/2018. CT chest 07/08/2018. FINDINGS: Heart size is stable and within normal limits. Masslike opacity in the RIGHT upper lobe is significantly improved, consistent with resolving pneumonia. Mild vascular congestion throughout both lung fields. No effusion or pneumothorax. Bones unremarkable. Previous thyroidectomy. IMPRESSION: Masslike opacity in the RIGHT upper lobe is significantly  improved, consistent with resolving pneumonia. Electronically Signed   By: Elsie StainJohn T Curnes M.D.   On: 08/23/2018 19:26      IMPRESSION AND PLAN:    Hyperosmolar hyperglycemic state and type 1 diabetes- no signs of infection.  Patient states he has not checked his blood sugar in 2-3 weeks. [facility] -Continue insulin drip -Glucose checks every hour -BMP every 4 hours -Check A1c  Questionable seizure-like activity- noted in triage.  CT head with and  without contrast is negative. -Ativan as needed for seizure-like activity -EEG -Neurology consult -Check UDS  Bradycardic episode- noted to be in the 20s initially with complete heart block, then spontaneously reverted back to sinus rhythm. Troponin negative. -Cardiology consult if patient continues to have bradycardic episodes -Cardiac monitoring  AKI- likely secondary to dehydration. -IV fluids -Avoid nephrotoxic agents -Holding home lisinopril  Hypertension-blood pressures elevated in the ED -Holding home lisinopril -Hydralazine IV as needed  [facility]- [facility]. Last CD4 count 439. -Continue home Biktarvy -Recheck CD4 count  Hypothyroidism- s/p thyroidectomy -Continue home Synthroid -Check TSH  Hyperlipidemia-stable -Continue home Lipitor   All the records are reviewed and case discussed with ED provider. Management plans discussed with the patient, family and they are in agreement.  CODE STATUS: Full  TOTAL TIME TAKING CARE OF THIS PATIENT: 45 minutes.    Jinny BlossomKaty D Billie Trager M.D on 08/23/2018 at 8:13 PM  Between 7am to 6pm - Pager - [phone]  After 6pm go to [email or web address] - Social research officer, governmentpassword [email or web address]  [facility] [place] [facility]  Office  [phone]  CC: Primary care physician; Patient, No Pcp Per   Note: This dictation was prepared with Dragon dictation along with smaller phrase technology. Any transcriptional errors that result from this process are unintentional.

## 2018-08-23 NOTE — ED Notes (Signed)
Pt now asking how he got here repeatedly and why he is here. Pt unable to remember anything about hospital visit or getting here. Dr Roxan Hockey aware.

## 2018-08-23 NOTE — ED Notes (Signed)
Pt to and from ct at this time , pt placed back on pads /monitor . NAD at this time .

## 2018-08-23 NOTE — ED Notes (Signed)
ED TO INPATIENT HANDOFF REPORT  ED Nurse Name and Phone #: Raphael Gibney Name/Age/Gender Jeffrey Pearson 58 y.o. male Room/Bed: ED26A/ED26A  Code Status   Code Status: Not on file  Home/SNF/Other Home Patient oriented to: self and place Is this baseline? No   Triage Complete: Triage complete  Chief Complaint Weakness  Triage Note Patient reports headaches that started the first week in April that has progressively gotten worse. With headaches c/o stiff neck,Loss of balance, difficulty standing up, losing desire to eat and weakness. A&O x4 during triage.    Allergies Allergies  Allergen Reactions  . Septra [Sulfamethoxazole-Trimethoprim] Rash    Level of Care/Admitting Diagnosis ED Disposition    ED Disposition Condition Comment   Admit  The patient appears reasonably stabilized for admission considering the current resources, flow, and capabilities available in the ED at this time, and I doubt any other [facility] requiring further screening and/or treatment in the ED prior to admission is  present.       B Medical/Surgery History Past Medical History:  Diagnosis Date  . Diabetes mellitus without complication (HCC)   . HIV (human immunodeficiency virus infection) (HCC)   . Hyperthyroidism   . Thyroid disease    Past Surgical History:  Procedure Laterality Date  . THYROIDECTOMY    . TONSILLECTOMY       A IV Location/Drains/Wounds Patient Lines/Drains/Airways Status   Active Line/Drains/Airways    Name:   Placement date:   Placement time:   Site:   Days:   Peripheral IV 08/23/18 Left Antecubital   08/23/18    2017    Antecubital   less than 1          Intake/Output Last 24 hours No intake or output data in the 24 hours ending 08/23/18 2020  Labs/Imaging Results for orders placed or performed during the hospital encounter of 08/23/18 (from the past 48 hour(s))  Urinalysis, Complete w Microscopic     Status: Abnormal   Collection Time: 08/23/18  6:20 PM  Result  Value Ref Range   Color, Urine COLORLESS (A) YELLOW   APPearance CLEAR (A) CLEAR   Specific Gravity, Urine 1.021 1.005 - 1.030   pH 7.0 5.0 - 8.0   Glucose, UA >=500 (A) NEGATIVE mg/dL   Hgb urine dipstick NEGATIVE NEGATIVE   Bilirubin Urine NEGATIVE NEGATIVE   Ketones, ur NEGATIVE NEGATIVE mg/dL   Protein, ur NEGATIVE NEGATIVE mg/dL   Nitrite NEGATIVE NEGATIVE   Leukocytes,Ua NEGATIVE NEGATIVE   RBC / HPF 0-5 0 - 5 RBC/hpf   WBC, UA 0-5 0 - 5 WBC/hpf   Bacteria, UA NONE SEEN NONE SEEN   Squamous Epithelial / LPF NONE SEEN 0 - 5    Comment: Performed at [facility], [address] [place], [place] [address]  CBC with Differential/Platelet     Status: Abnormal   Collection Time: 08/23/18  6:29 PM  Result Value Ref Range   WBC 4.7 4.0 - 10.5 K/uL   RBC 4.09 (L) 4.22 - 5.81 MIL/uL   Hemoglobin 13.2 13.0 - 17.0 g/dL   HCT 19.1 (L) 47.8 - 29.5 %   MCV 93.2 80.0 - 100.0 fL   MCH 32.3 26.0 - 34.0 pg   MCHC 34.6 30.0 - 36.0 g/dL   RDW 62.1 30.8 - 65.7 %   Platelets 113 (L) 150 - 400 K/uL    Comment: Immature Platelet Fraction may be clinically indicated, consider ordering this additional test [id number]    nRBC 0.0  0.0 - 0.2 %   Neutrophils Relative % 84 %   Neutro Abs 3.9 1.7 - 7.7 K/uL   Lymphocytes Relative 12 %   Lymphs Abs 0.6 (L) 0.7 - 4.0 K/uL   Monocytes Relative 3 %   Monocytes Absolute 0.2 0.1 - 1.0 K/uL   Eosinophils Relative 1 %   Eosinophils Absolute 0.0 0.0 - 0.5 K/uL   Basophils Relative 0 %   Basophils Absolute 0.0 0.0 - 0.1 K/uL   Immature Granulocytes 0 %   Abs Immature Granulocytes 0.01 0.00 - 0.07 K/uL    Comment: Performed at [facility], [address] [place], [place] [address]  Comprehensive metabolic panel     Status: Abnormal   Collection Time: 08/23/18  6:29 PM  Result Value Ref Range   Sodium 122 (L) 135 - 145 mmol/L   Potassium 5.2 (H) 3.5 - 5.1 mmol/L   Chloride 85 (L) 98 - 111 mmol/L   CO2 23 22 - 32 mmol/L   Glucose,  Bld 788 (HH) 70 - 99 mg/dL    Comment: CRITICAL RESULT CALLED TO, READ BACK BY AND VERIFIED WITH JESSICA FULCHER 08/23/18 @ 1855  MLK    BUN 21 (H) 6 - 20 mg/dL   Creatinine, Ser 6.041.27 (H) 0.61 - 1.24 mg/dL   Calcium 8.9 8.9 - 54.010.3 mg/dL   Total Protein 8.0 6.5 - 8.1 g/dL   Albumin 4.1 3.5 - 5.0 g/dL   AST 57 (H) 15 - 41 U/L   ALT 62 (H) 0 - 44 U/L   Alkaline Phosphatase 183 (H) 38 - 126 U/L   Total Bilirubin 0.8 0.3 - 1.2 mg/dL   GFR calc non Af Amer >60 >60 mL/min   GFR calc Af Amer >60 >60 mL/min   Anion gap 14 5 - 15    Comment: Performed at [facility], [address] [place], [place] [address]  Magnesium     Status: None   Collection Time: 08/23/18  6:33 PM  Result Value Ref Range   Magnesium 1.9 1.7 - 2.4 mg/dL    Comment: Performed at [facility], [address] [place], [place] [address]  Troponin I - Add-On to previous collection     Status: None   Collection Time: 08/23/18  6:33 PM  Result Value Ref Range   Troponin I <0.03 <0.03 ng/mL    Comment: Performed at [facility], [address] [place], [place] [address]   Ct Head W Or Wo Contrast  Result Date: 08/23/2018 CLINICAL DATA:  Altered mental status EXAM: CT HEAD WITHOUT AND WITH CONTRAST TECHNIQUE: Contiguous axial images were obtained from the base of the skull through the vertex without and with intravenous contrast CONTRAST:  75mL OMNIPAQUE IOHEXOL 300 MG/ML  SOLN COMPARISON:  None. FINDINGS: Brain: There is no mass, hemorrhage or extra-axial collection. The size and configuration of the ventricles and extra-axial CSF spaces are normal. There is hypoattenuation of the white matter, most commonly indicating chronic small vessel disease. No abnormal contrast enhancement. Vascular: No abnormal hyperdensity of the major intracranial arteries or dural venous sinuses. No intracranial atherosclerosis. Skull: The visualized skull base, calvarium and extracranial soft tissues are normal.  Sinuses/Orbits: No fluid levels or advanced mucosal thickening of the visualized paranasal sinuses. No mastoid or middle ear effusion. The orbits are normal. IMPRESSION: 1. No enhancing intracranial lesions. 2. Chronic microvascular ischemic changes of the white matter. 3. No acute intracranial abnormality. Electronically Signed   By: Caryn BeeKevin  Chase Picket M.D.   On: 08/23/2018 19:35   Dg Chest Portable 1 View  Result Date: 08/23/2018 CLINICAL DATA:  Altered mental status. EXAM: PORTABLE CHEST 1 VIEW COMPARISON:  Chest radiograph 07/08/2018. CT chest 07/08/2018. FINDINGS: Heart size is stable and within normal limits. Masslike opacity in the RIGHT upper lobe is significantly improved, consistent with resolving pneumonia. Mild vascular congestion throughout both lung fields. No effusion or pneumothorax. Bones unremarkable. Previous thyroidectomy. IMPRESSION: Masslike opacity in the RIGHT upper lobe is significantly improved, consistent with resolving pneumonia. Electronically Signed   By: Elsie Stain M.D.   On: 08/23/2018 19:26    Pending Labs Unresulted Labs (From admission, onward)    Start     Ordered   08/23/18 1956  Osmolality  Add-on,   AD     08/23/18 1955          Vitals/Pain Today's Vitals   08/23/18 1845 08/23/18 1900 08/23/18 1915 08/23/18 1930  BP:    (!) 182/158  Pulse: 82   86  Resp: 19 (!) 27 (!) 25 (!) 23  Temp:      TempSrc:      SpO2: 99%   99%  Weight:      Height:      PainSc:        Isolation Precautions No active isolations  Medications Medications  dextrose 5 %-0.45 % sodium chloride infusion (has no administration in time range)  insulin regular, human (MYXREDLIN) 100 units/ 100 mL infusion (has no administration in time range)  sodium chloride 0.9 % bolus 1,000 mL (1,000 mLs Intravenous New Bag/Given 08/23/18 1842)  LORazepam (ATIVAN) injection 1 mg (1 mg Intravenous Given by Other 08/23/18 1837)  iohexol (OMNIPAQUE) 300 MG/ML solution 75 mL (75 mLs Intravenous  Contrast Given 08/23/18 1915)    Mobility walks with person assist Low fall risk   Focused Assessments    R Recommendations: See Admitting Provider Note  Report given to:   Additional Notes: none

## 2018-08-23 NOTE — ED Provider Notes (Signed)
[facility] Emergency Department Provider Note    First MD Initiated Contact with Patient 08/23/18 1818     (approximate)  I have reviewed the triage vital signs and the nursing notes.   HISTORY  Chief Complaint Migraine; Weakness; and Torticollis    HPI Jeffrey Pearson is a 58 y.o. male extensive past medical history as listed below presents the ER with 2 weeks of progressively worsening daily headache.  States he is also feeling unbalanced and weak.  States that he is having frontal headache daily but those are progressing.  Denies any fevers or chills.  States he is also feeling "twitchy ".  While in triage patient was noted to have brief twitching where he turned his head to the right eyes were roving back-and-forth lasting roughly 30 seconds.  He was still talking during this episode.  He came back to ER room.  During our history is started doing the same.  He was able to talk and respond to questions when he was doing this twitching behavior.  Did not fully lose consciousness however it was noted on the cardiac monitor the patient became bradycardic to the low 20s appeared to be complete heart block.  Unable to get a full 12-lead during this episode as he reverted back to his normal sinus.  After this episode patient was unable to recall what happened.    Past Medical History:  Diagnosis Date  . Diabetes mellitus without complication (HCC)   . HIV (human immunodeficiency virus infection) (HCC)   . Hyperthyroidism   . Thyroid disease    History reviewed. No pertinent family history. Past Surgical History:  Procedure Laterality Date  . THYROIDECTOMY    . TONSILLECTOMY     There are no active problems to display for this patient.     Prior to Admission medications   Medication Sig Start Date End Date Taking? Authorizing Provider  azithromycin (ZITHROMAX Z-PAK) 250 MG tablet Take 2 tablets (500 mg) on  Day 1 and day 2,  followed by 1 tablet (250 mg)  once daily on Days 3 through 7. 07/08/18   Evon Slack, PA-C  HYDROcodone-acetaminophen (NORCO/VICODIN) 5-325 MG tablet Take 1 tablet by mouth every 4 (four) hours as needed. 01/18/16   Minna Antis, MD    Allergies Septra [sulfamethoxazole-trimethoprim]    Social History Social History   Tobacco Use  . Smoking status: Never Smoker  . Smokeless tobacco: Never Used  Substance Use Topics  . Alcohol use: No  . Drug use: Never    Review of Systems Patient denies headaches, rhinorrhea, blurry vision, numbness, shortness of breath, chest pain, edema, cough, abdominal pain, nausea, vomiting, diarrhea, dysuria, fevers, rashes or hallucinations unless otherwise stated above in HPI. ____________________________________________   PHYSICAL EXAM:  VITAL SIGNS: Vitals:   08/23/18 1820  BP: (!) 148/110  Pulse: 80  Resp: (!) 26  Temp: 99.3 F (37.4 C)  SpO2: 99%    Constitutional: Alert and oriented.  Eyes: Conjunctivae are normal.  Head: Atraumatic. Nose: No congestion/rhinnorhea. Mouth/Throat: Mucous membranes are moist.   Neck: No stridor. Painless ROM.  Cardiovascular: Normal rate, regular rhythm. Grossly normal heart sounds.  Good peripheral circulation. Respiratory: Normal respiratory effort.  No retractions. Lungs CTAB. Gastrointestinal: Soft and nontender. No distention. No abdominal bruits. No CVA tenderness. Genitourinary:  Musculoskeletal: No lower extremity tenderness nor edema.  No joint effusions. Neurologic:  Normal speech and language. No gross focal neurologic deficits are appreciated. No facial droop.  MAE  spontaneously.  Had brief 5 to 7-second twitching episode with right I would deviation progressing to generalized shaking of all extremities with the patient grabbing the bed rail.  He is able to answer questions during this episode and did not fully lose consciousness. Skin:  Skin is warm, dry and intact. No rash noted. Psychiatric: Mood and affect are  normal. Speech and behavior are normal.  ____________________________________________   LABS (all labs ordered are listed, but only abnormal results are displayed)  Results for orders placed or performed during the hospital encounter of 08/23/18 (from the past 24 hour(s))  Urinalysis, Complete w Microscopic     Status: Abnormal   Collection Time: 08/23/18  6:20 PM  Result Value Ref Range   Color, Urine COLORLESS (A) YELLOW   APPearance CLEAR (A) CLEAR   Specific Gravity, Urine 1.021 1.005 - 1.030   pH 7.0 5.0 - 8.0   Glucose, UA >=500 (A) NEGATIVE mg/dL   Hgb urine dipstick NEGATIVE NEGATIVE   Bilirubin Urine NEGATIVE NEGATIVE   Ketones, ur NEGATIVE NEGATIVE mg/dL   Protein, ur NEGATIVE NEGATIVE mg/dL   Nitrite NEGATIVE NEGATIVE   Leukocytes,Ua NEGATIVE NEGATIVE   RBC / HPF 0-5 0 - 5 RBC/hpf   WBC, UA 0-5 0 - 5 WBC/hpf   Bacteria, UA NONE SEEN NONE SEEN   Squamous Epithelial / LPF NONE SEEN 0 - 5  CBC with Differential/Platelet     Status: Abnormal   Collection Time: 08/23/18  6:29 PM  Result Value Ref Range   WBC 4.7 4.0 - 10.5 K/uL   RBC 4.09 (L) 4.22 - 5.81 MIL/uL   Hemoglobin 13.2 13.0 - 17.0 g/dL   HCT 33.2 (L) 95.1 - 88.4 %   MCV 93.2 80.0 - 100.0 fL   MCH 32.3 26.0 - 34.0 pg   MCHC 34.6 30.0 - 36.0 g/dL   RDW 16.6 06.3 - 01.6 %   Platelets 113 (L) 150 - 400 K/uL   nRBC 0.0 0.0 - 0.2 %   Neutrophils Relative % 84 %   Neutro Abs 3.9 1.7 - 7.7 K/uL   Lymphocytes Relative 12 %   Lymphs Abs 0.6 (L) 0.7 - 4.0 K/uL   Monocytes Relative 3 %   Monocytes Absolute 0.2 0.1 - 1.0 K/uL   Eosinophils Relative 1 %   Eosinophils Absolute 0.0 0.0 - 0.5 K/uL   Basophils Relative 0 %   Basophils Absolute 0.0 0.0 - 0.1 K/uL   Immature Granulocytes 0 %   Abs Immature Granulocytes 0.01 0.00 - 0.07 K/uL  Comprehensive metabolic panel     Status: Abnormal   Collection Time: 08/23/18  6:29 PM  Result Value Ref Range   Sodium 122 (L) 135 - 145 mmol/L   Potassium 5.2 (H) 3.5 - 5.1  mmol/L   Chloride 85 (L) 98 - 111 mmol/L   CO2 23 22 - 32 mmol/L   Glucose, Bld 788 (HH) 70 - 99 mg/dL   BUN 21 (H) 6 - 20 mg/dL   Creatinine, Ser 0.10 (H) 0.61 - 1.24 mg/dL   Calcium 8.9 8.9 - 93.2 mg/dL   Total Protein 8.0 6.5 - 8.1 g/dL   Albumin 4.1 3.5 - 5.0 g/dL   AST 57 (H) 15 - 41 U/L   ALT 62 (H) 0 - 44 U/L   Alkaline Phosphatase 183 (H) 38 - 126 U/L   Total Bilirubin 0.8 0.3 - 1.2 mg/dL   GFR calc non Af Amer >60 >60 mL/min   GFR calc  Af Amer >60 >60 mL/min   Anion gap 14 5 - 15  Magnesium     Status: None   Collection Time: 08/23/18  6:33 PM  Result Value Ref Range   Magnesium 1.9 1.7 - 2.4 mg/dL  Troponin I - Add-On to previous collection     Status: None   Collection Time: 08/23/18  6:33 PM  Result Value Ref Range   Troponin I <0.03 <0.03 ng/mL   ____________________________________________  EKG My review and personal interpretation at Time: 18:12   Indication: seizure activty  Rate: 90  Rhythm: sinus Axis: normal Other: normal intervals, no stemi ____________________________________________  RADIOLOGY  I personally reviewed all radiographic images ordered to evaluate for the above acute complaints and reviewed radiology reports and findings.  These findings were personally discussed with the patient.  Please see medical record for radiology report.  ____________________________________________   PROCEDURES  Procedure(s) performed:  .Critical Care Performed by: Willy Eddyobinson, Donalda Job, MD Authorized by: Willy Eddyobinson, Yasser Hepp, MD   Critical care provider statement:    Critical care time (minutes):  35   Critical care time was exclusive of:  Separately billable procedures and treating other patients   Critical care was necessary to treat or prevent imminent or life-threatening deterioration of the following conditions:  Endocrine crisis   Critical care was time spent personally by me on the following activities:  Development of treatment plan with patient or  surrogate, discussions with consultants, evaluation of patient's response to treatment, examination of patient, obtaining history from patient or surrogate, ordering and performing treatments and interventions, ordering and review of laboratory studies, ordering and review of radiographic studies, pulse oximetry, re-evaluation of patient's condition and review of old charts      Critical Care performed: yes ____________________________________________   INITIAL IMPRESSION / ASSESSMENT AND PLAN / ED COURSE  Pertinent labs & imaging results that were available during my care of the patient were reviewed by me and considered in my medical decision making (see chart for details).   DDX: Dehydration, sepsis, pna, uti, hypoglycemia, cva, drug effect, withdrawal, encephalitis   Jeffrey Pearson is a 58 y.o. who presents to the ED with was as described above witnessed seizure-like activity concern for partial seizure followed by bradycardic episode which reverted back to a sinus rhythm.  Patient placed on cardiac monitor as well as pacer pads for what appeared to be complete heart block briefly on telemetry.  He is without any hypoxia at this moment.  Blood will be sent for the by differential.  CT imaging with and without contrast will be ordered as the patient is HIV positive with progressively worsening headache concerning for CNS lesion.  Clinical Course as of Aug 22 2008  Thu Aug 23, 2018  1955 No sign of acute infectious process.  He is not febrile patient with profound hyperglycemia concerning [facility] still pending osmolality but will start IV insulin infusion in addition to IV fluids.  Gust with hospitalist for further medical management.   [PR]    Clinical Course User Index [PR] Willy Eddyobinson, Daelyn Pettaway, MD    The patient was evaluated in Emergency Department today for the symptoms described in the history of present illness. He/she was evaluated in the context of the global COVID-19 pandemic,  which necessitated consideration that the patient might be at risk for infection with the SARS-CoV-2 virus that causes COVID-19. Institutional protocols and algorithms that pertain to the evaluation of patients at risk for COVID-19 are in a state of rapid change based  on information released by regulatory bodies including the CDC and federal and state organizations. These policies and algorithms were followed during the patient's care in the ED.  As part of my medical decision making, I reviewed the following data within the electronic MEDICAL RECORD NUMBER Nursing notes reviewed and incorporated, Labs reviewed, notes from prior ED visits and [place] Controlled Substance Database   ____________________________________________   FINAL CLINICAL IMPRESSION(S) / ED DIAGNOSES  Final diagnoses:  Hyperglycemia  Acute encephalopathy  Bradycardia      NEW MEDICATIONS STARTED DURING THIS VISIT:  New Prescriptions   No medications on file     Note:  This document was prepared using Dragon voice recognition software and may include unintentional dictation errors.    Willy Eddy, MD 08/23/18 2011

## 2018-08-23 NOTE — ED Triage Notes (Signed)
Patient reports headaches that started the first week in April that has progressively gotten worse. With headaches c/o stiff neck,Loss of balance, difficulty standing up, losing desire to eat and weakness. A&O x4 during triage.

## 2018-08-23 NOTE — ED Notes (Signed)
Pt urinated on bed. Bed cleaned and chucks placed on bed. Pt placed in hospital gown at this time.

## 2018-08-23 NOTE — Progress Notes (Signed)
eLink Physician-Brief Progress Note Patient Name: Jeffrey Pearson DOB: 1960-12-09 MRN: [id number]   Date of Service  08/23/2018  HPI/Events of Note  Known diabetic admitted with hyperosmolar syndrome secondary to non-compliance.  eICU Interventions  New patient evaluation completed        Migdalia Dk 08/23/2018, 10:41 PM

## 2018-08-23 NOTE — ED Notes (Addendum)
Near end up triage patient started stuttering his words, could not talk in complete sentences, stiffening up in the wheelchair, unable to move extremities without help, and had his body turned towards the back of the room, hallucinating, referencing another woman in the room. Patient taken to room 26

## 2018-08-23 NOTE — ED Notes (Signed)
This writer sitting 1:1 at this time. Mayo, MD at bedside.

## 2018-08-23 NOTE — Plan of Care (Signed)
Explained to patient the need to monitor Blood Sugars especially when he is not feeling well.

## 2018-08-23 NOTE — ED Notes (Signed)
Zach sitting with patient at this time.

## 2018-08-23 NOTE — Consult Note (Signed)
Name: Jeffrey Pearson MRN: [id number] DOB: 1961/02/07    ADMISSION DATE:  08/23/2018 CONSULTATION DATE:  08/23/2018  REFERRING MD :  Dr. Nancy Marus  CHIEF COMPLAINT:  Migraine headache, Weakness  BRIEF PATIENT DESCRIPTION:  58 year old male with past medical history notable for diabetes type 2, HIV, hypothyroidism status post thyroidectomy who is admitted with Hyperosmolar Hyperglycemic State requiring insulin drip, AKI,  questionable seizure-like activity, and transient episode of Complete Heart Block which reverted to NSR without any intervention.  SIGNIFICANT EVENTS  08/23/18>> admission to [facility] stepdown; Neurology consulted  STUDIES:  CT Head 08/23/18>> 1. No enhancing intracranial lesions. 2. Chronic microvascular ischemic changes of the white matter. 3. No acute intracranial abnormality. EEG 08/24/18>>  CULTURES: MRSA PCR 4/23>>  ANTIBIOTICS: N/A  HISTORY OF PRESENT ILLNESS:   Jeffrey Pearson is a 58 year old male with a past medical history of type 2 diabetes, HIV, hypothyroidism status post thyroidectomy who presents to [facility] ED on 08/23/2018 with complaints of generalized weakness and progressive headaches over the past 2 weeks.  Given his headaches and weakness he has had very poor p.o. and fluid intake. He reports he has not been checking his blood sugar for the past 2 to 3 weeks, and has not been taking his insulin as prescribed.  While in ED triage, he had a suspected partial seizure-like activity (approximately 30 seconds long) in which he was noted to have twitching with his eyes moving back and forth (never lost consciousness during episode).  Following this episode he was also noted to be in complete heart block (HR in 20's) which reverted back to normal sinus rhythm spontaneously without any medical intervention needed.  Initial work-up in the ED reveals glucose 788, serum osmolality 304, sodium 122, potassium 5.2, creatinine 1.27.  Urinalysis is negative for UTI and negative for  ketones.  Chest x-ray reveals improving right upper lobe opacity consistent with resolving pneumonia.  CT of the head negative for any acute acute intracranial process.  Troponin is negative, WBC 4.7.  Urine drug screen is pending.  He was given 1 L normal saline bolus and placed on IV insulin drip.  He is being admitted to [facility] stepdown unit for treatment of Hyperosmolar Hyperglycemic State requiring insulin drip, AKI, and questionable seizure-like activity.  PCCM is consulted for further management.  PAST MEDICAL HISTORY :   has a past medical history of Diabetes mellitus without complication (HCC), HIV (human immunodeficiency virus infection) (HCC), Hyperthyroidism, and Thyroid disease.  has a past surgical history that includes Tonsillectomy and Thyroidectomy. Prior to Admission medications   Medication Sig Start Date End Date Taking? Authorizing Provider  azithromycin (ZITHROMAX Z-PAK) 250 MG tablet Take 2 tablets (500 mg) on  Day 1 and day 2,  followed by 1 tablet (250 mg) once daily on Days 3 through 7. 07/08/18   Evon Slack, PA-C  HYDROcodone-acetaminophen (NORCO/VICODIN) 5-325 MG tablet Take 1 tablet by mouth every 4 (four) hours as needed. 01/18/16   Minna Antis, MD   Allergies  Allergen Reactions   Septra [Sulfamethoxazole-Trimethoprim] Rash    FAMILY HISTORY:  family history is not on file. SOCIAL HISTORY:  reports that he has never smoked. He has never used smokeless tobacco. He reports that he does not drink alcohol or use drugs.   REVIEW OF SYSTEMS: Positives in bold: Pt currently denies all complaints Constitutional: Negative for fever, chills, weight loss, malaise/fatigue and diaphoresis.  HENT: Negative for hearing loss, ear pain, nosebleeds, congestion, sore throat, neck pain, tinnitus and ear  discharge.   Eyes: Negative for blurred vision, double vision, photophobia, pain, discharge and redness.  Respiratory: Negative for cough, hemoptysis, sputum production,  shortness of breath, wheezing and stridor.   Cardiovascular: Negative for chest pain, palpitations, orthopnea, claudication, leg swelling and PND.  Gastrointestinal: Negative for heartburn, nausea, vomiting, abdominal pain, diarrhea, constipation, blood in stool and melena.  Genitourinary: Negative for dysuria, urgency, frequency, hematuria and flank pain.  Musculoskeletal: Negative for myalgias, back pain, joint pain and falls.  Skin: Negative for itching and rash.  Neurological: Negative for dizziness, tingling, tremors, sensory change, speech change, focal weakness, seizures, loss of consciousness, weakness and headaches.  Endo/Heme/Allergies: Negative for environmental allergies and polydipsia. Does not bruise/bleed easily.  SUBJECTIVE:  -Patient reports he is feeling better since being admitted, and that his appetite is starting to return -Denies shortness of breath, chest pain, cough, fever or chills, nausea, vomiting, abdominal pain, headache -On room air  VITAL SIGNS: Temp:  [99.3 F (37.4 C)] 99.3 F (37.4 C) (04/23 1820) Pulse Rate:  [73-86] 78 (04/23 2058) Resp:  [18-27] 18 (04/23 2058) BP: (148-182)/(87-158) 155/87 (04/23 2058) SpO2:  [96 %-99 %] 96 % (04/23 2058) Weight:  [95.3 kg] 95.3 kg (04/23 1809)  PHYSICAL EXAMINATION: General: Acutely ill-appearing male, sitting in bed, on room air, no acute distress Neuro: Awake, alert and oriented, follows commands, no focal deficits, speech clear HEENT: Atraumatic, normocephalic, neck supple, no JVD, pupils PERRLA Cardiovascular: Regular rate and rhythm, S1-S2, no murmurs rubs or gallops, 2+ pulses Lungs: Clear to auscultation bilaterally, even, nonlabored, normal effort Abdomen: Soft, nontender, no guarding or rebound tenderness, bowel sounds hypoactive Musculoskeletal: Normal bulk and tone, no deformities, no edema Skin: Warm and dry, no obvious rashes lesions or ulcerations  Recent Labs  Lab 08/23/18 1829  NA 122*  K  5.2*  CL 85*  CO2 23  BUN 21*  CREATININE 1.27*  GLUCOSE 788*   Recent Labs  Lab 08/23/18 1829  HGB 13.2  HCT 38.1*  WBC 4.7  PLT 113*   Ct Head W Or Wo Contrast  Result Date: 08/23/2018 CLINICAL DATA:  Altered mental status EXAM: CT HEAD WITHOUT AND WITH CONTRAST TECHNIQUE: Contiguous axial images were obtained from the base of the skull through the vertex without and with intravenous contrast CONTRAST:  75mL OMNIPAQUE IOHEXOL 300 MG/ML  SOLN COMPARISON:  None. FINDINGS: Brain: There is no mass, hemorrhage or extra-axial collection. The size and configuration of the ventricles and extra-axial CSF spaces are normal. There is hypoattenuation of the white matter, most commonly indicating chronic small vessel disease. No abnormal contrast enhancement. Vascular: No abnormal hyperdensity of the major intracranial arteries or dural venous sinuses. No intracranial atherosclerosis. Skull: The visualized skull base, calvarium and extracranial soft tissues are normal. Sinuses/Orbits: No fluid levels or advanced mucosal thickening of the visualized paranasal sinuses. No mastoid or middle ear effusion. The orbits are normal. IMPRESSION: 1. No enhancing intracranial lesions. 2. Chronic microvascular ischemic changes of the white matter. 3. No acute intracranial abnormality. Electronically Signed   By: Deatra RobinsonKevin  Herman M.D.   On: 08/23/2018 19:35   Dg Chest Portable 1 View  Result Date: 08/23/2018 CLINICAL DATA:  Altered mental status. EXAM: PORTABLE CHEST 1 VIEW COMPARISON:  Chest radiograph 07/08/2018. CT chest 07/08/2018. FINDINGS: Heart size is stable and within normal limits. Masslike opacity in the RIGHT upper lobe is significantly improved, consistent with resolving pneumonia. Mild vascular congestion throughout both lung fields. No effusion or pneumothorax. Bones unremarkable. Previous thyroidectomy. IMPRESSION: Masslike  opacity in the RIGHT upper lobe is significantly improved, consistent with resolving  pneumonia. Electronically Signed   By: Elsie Stain M.D.   On: 08/23/2018 19:26    ASSESSMENT / PLAN:  Hyperosmolar Hyperglycemic State -Aggressive IV fluids -IV insulin drip -Follow BMP every 4 hours -Serum osmolality 304 -Hemoglobin A1c pending -No obvious signs of infection (urinalysis is negative, chest x-ray with resolving pneumonia of right upper lobe) -Once serum glucose <250, can convert to SSI  Questionable seizure-like activity noted in ED -PRN Ativan -CT head negative -Neurology consulted, appreciate input -EEG pending -Urine drug screen pending  Transient bradycardia episode (complete heart block with HR 20's) which converted spontaneously back to sinus rhythm -Cardiac monitoring -Troponin negative -If has recurrent episodes, will consult Cardiology  AKI in the setting of dehydration Pseudohyponatremia (corrected Na given glucose of 788 is Na 133) -Monitor I&O's / urinary output -Follow BMP -Ensure adequate renal perfusion -Avoid nephrotoxic agents as able -Replace electrolytes as indicated -IV fluids  HIV, follows at [facility] (last known CD4 count 439) -Continue home Biktarvy -Recheck CD4 count pending  Mild transaminitis -N.p.o. for now -Trend LFTs  Hypothyroidism status post thyroidectomy -Continue home Synthroid -TSH pending    Disposition: Stepdown Goals of care: Full code VTE prophylaxis: Lovenox Updates: Updated patient at bedside 08/23/2018  Harlon Ditty, [facility] Pager: [phone] Cell: [phone]  08/23/2018, 9:07 PM

## 2018-08-24 ENCOUNTER — Inpatient Hospital Stay: Payer: No Typology Code available for payment source

## 2018-08-24 DIAGNOSIS — E1101 Type 2 diabetes mellitus with hyperosmolarity with coma: Secondary | ICD-10-CM

## 2018-08-24 DIAGNOSIS — G9340 Encephalopathy, unspecified: Secondary | ICD-10-CM

## 2018-08-24 LAB — GLUCOSE, CAPILLARY
Glucose-Capillary: 395 mg/dL — ABNORMAL HIGH (ref 70–99)
Glucose-Capillary: 279 mg/dL — ABNORMAL HIGH (ref 70–99)
Glucose-Capillary: 204 mg/dL — ABNORMAL HIGH (ref 70–99)
Glucose-Capillary: 209 mg/dL — ABNORMAL HIGH (ref 70–99)
Glucose-Capillary: 186 mg/dL — ABNORMAL HIGH (ref 70–99)
Glucose-Capillary: 149 mg/dL — ABNORMAL HIGH (ref 70–99)
Glucose-Capillary: >600 mg/dL (ref 70–99)
Glucose-Capillary: 280 mg/dL — ABNORMAL HIGH (ref 70–99)
Glucose-Capillary: 285 mg/dL — ABNORMAL HIGH (ref 70–99)
Glucose-Capillary: 315 mg/dL — ABNORMAL HIGH (ref 70–99)

## 2018-08-24 LAB — BASIC METABOLIC PANEL
Anion gap: 12 (ref 5–15)
Anion gap: 6 (ref 5–15)
BUN: 20 mg/dL (ref 6–20)
BUN: 16 mg/dL (ref 6–20)
CO2: 27 mmol/L (ref 22–32)
CO2: 27 mmol/L (ref 22–32)
Calcium: 8.9 mg/dL (ref 8.9–10.3)
Calcium: 8.3 mg/dL — ABNORMAL LOW (ref 8.9–10.3)
Chloride: 90 mmol/L — ABNORMAL LOW (ref 98–111)
Chloride: 101 mmol/L (ref 98–111)
Creatinine, Ser: 1.11 mg/dL (ref 0.61–1.24)
Creatinine, Ser: 0.96 mg/dL (ref 0.61–1.24)
GFR calc Af Amer: >60 mL/min (ref >60)
GFR calc Af Amer: >60 mL/min (ref >60)
GFR calc non Af Amer: >60 mL/min (ref >60)
GFR calc non Af Amer: >60 mL/min (ref >60)
Glucose, Bld: 517 mg/dL (ref 70–99)
Glucose, Bld: 304 mg/dL — ABNORMAL HIGH (ref 70–99)
Potassium: 4.9 mmol/L (ref 3.5–5.1)
Potassium: 3.6 mmol/L (ref 3.5–5.1)
Sodium: 129 mmol/L — ABNORMAL LOW (ref 135–145)
Sodium: 134 mmol/L — ABNORMAL LOW (ref 135–145)

## 2018-08-24 LAB — COMPREHENSIVE METABOLIC PANEL
ALT: 46 U/L — ABNORMAL HIGH (ref 0–44)
AST: 37 U/L (ref 15–41)
Albumin: 3.2 g/dL — ABNORMAL LOW (ref 3.5–5.0)
Alkaline Phosphatase: 121 U/L (ref 38–126)
Anion gap: 10 (ref 5–15)
BUN: 15 mg/dL (ref 6–20)
CO2: 26 mmol/L (ref 22–32)
Calcium: 8.2 mg/dL — ABNORMAL LOW (ref 8.9–10.3)
Chloride: 99 mmol/L (ref 98–111)
Creatinine, Ser: 0.89 mg/dL (ref 0.61–1.24)
GFR calc Af Amer: >60 mL/min (ref >60)
GFR calc non Af Amer: >60 mL/min (ref >60)
Glucose, Bld: 168 mg/dL — ABNORMAL HIGH (ref 70–99)
Potassium: 3.3 mmol/L — ABNORMAL LOW (ref 3.5–5.1)
Sodium: 135 mmol/L (ref 135–145)
Total Bilirubin: 0.6 mg/dL (ref 0.3–1.2)
Total Protein: 6.1 g/dL — ABNORMAL LOW (ref 6.5–8.1)

## 2018-08-24 LAB — HEMOGLOBIN A1C
Hgb A1c MFr Bld: 13.1 % — ABNORMAL HIGH (ref 4.8–5.6)
Mean Plasma Glucose: 329.27 mg/dL

## 2018-08-24 LAB — URINE DRUG SCREEN, QUALITATIVE (ARMC ONLY)
Amphetamines, Ur Screen: NOT DETECTED
Barbiturates, Ur Screen: NOT DETECTED
Benzodiazepine, Ur Scrn: NOT DETECTED
Cannabinoid 50 Ng, Ur ~~LOC~~: NOT DETECTED
Cocaine Metabolite,Ur ~~LOC~~: NOT DETECTED
MDMA (Ecstasy)Ur Screen: NOT DETECTED
Methadone Scn, Ur: NOT DETECTED
Opiate, Ur Screen: NOT DETECTED
Phencyclidine (PCP) Ur S: NOT DETECTED
Tricyclic, Ur Screen: NOT DETECTED

## 2018-08-24 LAB — TSH: TSH: 1.209 u[IU]/mL (ref 0.350–4.500)

## 2018-08-24 LAB — CBC
HCT: 33 % — ABNORMAL LOW (ref 39.0–52.0)
Hemoglobin: 11.6 g/dL — ABNORMAL LOW (ref 13.0–17.0)
MCH: 32.4 pg (ref 26.0–34.0)
MCHC: 35.2 g/dL (ref 30.0–36.0)
MCV: 92.2 fL (ref 80.0–100.0)
Platelets: 113 10*3/uL — ABNORMAL LOW (ref 150–400)
RBC: 3.58 MIL/uL — ABNORMAL LOW (ref 4.22–5.81)
RDW: 11.6 % (ref 11.5–15.5)
WBC: 5.1 10*3/uL (ref 4.0–10.5)
nRBC: 0 % (ref 0.0–0.2)

## 2018-08-24 MED ORDER — INSULIN ASPART 100 UNIT/ML ~~LOC~~ SOLN
5.0000 [IU] | Freq: Three times a day (TID) | SUBCUTANEOUS | Status: DC
  Administered 2018-08-24 – 2018-08-25 (×2): 5 [IU] via SUBCUTANEOUS
  Filled 2018-08-24 (×2): qty 1

## 2018-08-24 MED ORDER — INSULIN ASPART 100 UNIT/ML ~~LOC~~ SOLN
0.0000 [IU] | Freq: Three times a day (TID) | SUBCUTANEOUS | Status: DC
  Administered 2018-08-24: 13:00:00 8 [IU] via SUBCUTANEOUS
  Administered 2018-08-24: 08:00:00 2 [IU] via SUBCUTANEOUS
  Administered 2018-08-24: 8 [IU] via SUBCUTANEOUS
  Administered 2018-08-25: 08:00:00 3 [IU] via SUBCUTANEOUS
  Filled 2018-08-24 (×4): qty 1

## 2018-08-24 MED ORDER — SODIUM CHLORIDE 0.9 % IV SOLN
INTRAVENOUS | Status: DC
  Administered 2018-08-24: 06:00:00 via INTRAVENOUS

## 2018-08-24 MED ORDER — ACETAMINOPHEN 325 MG PO TABS
650.0000 mg | ORAL_TABLET | Freq: Four times a day (QID) | ORAL | Status: DC | PRN
  Filled 2018-08-24: qty 2

## 2018-08-24 MED ORDER — POTASSIUM CHLORIDE CRYS ER 20 MEQ PO TBCR
30.0000 meq | EXTENDED_RELEASE_TABLET | Freq: Once | ORAL | Status: AC
  Administered 2018-08-24: 08:00:00 30 meq via ORAL
  Filled 2018-08-24: qty 2

## 2018-08-24 MED ORDER — INSULIN GLARGINE 100 UNIT/ML ~~LOC~~ SOLN
20.0000 [IU] | Freq: Every day | SUBCUTANEOUS | Status: DC
  Administered 2018-08-24 – 2018-08-25 (×2): 20 [IU] via SUBCUTANEOUS
  Filled 2018-08-24 (×2): qty 0.2

## 2018-08-24 MED ORDER — IBUPROFEN 400 MG PO TABS
200.0000 mg | ORAL_TABLET | Freq: Once | ORAL | Status: AC
  Administered 2018-08-24: 13:00:00 200 mg via ORAL
  Filled 2018-08-24: qty 1

## 2018-08-24 MED ORDER — INSULIN ASPART 100 UNIT/ML ~~LOC~~ SOLN
0.0000 [IU] | Freq: Every day | SUBCUTANEOUS | Status: DC
  Administered 2018-08-24: 22:00:00 4 [IU] via SUBCUTANEOUS
  Filled 2018-08-24: qty 1

## 2018-08-24 NOTE — Progress Notes (Signed)
eeg completed ° °

## 2018-08-24 NOTE — Progress Notes (Signed)
Pt transferred out of ICU to room 250 via wc.  No distress on ra, cardiac monitor applied to patient and verified with Baird Lyons, CNA.  Denies chest pain at this time, oriented to room and surroundings.  CB in reach, SR up x 2.

## 2018-08-24 NOTE — Progress Notes (Signed)
Inpatient Diabetes Program Recommendations  AACE/ADA: New Consensus Statement on Inpatient Glycemic Control (2015)  Target Ranges:  Prepandial:   less than 140 mg/dL      Peak postprandial:   less than 180 mg/dL (1-2 hours)      Critically ill patients:  140 - 180 mg/dL   Lab Results  Component Value Date   GLUCAP 149 (H) 08/24/2018   HGBA1C 13.1 (H) 08/24/2018    Review of Glycemic Control Results for Jeffrey Pearson, SCOUT (MRN [id number]) as of 08/24/2018 11:35  Ref. Range 08/23/2018 23:50 08/24/2018 00:54 08/24/2018 01:38 08/24/2018 02:52 08/24/2018 04:09 08/24/2018 05:17 08/24/2018 07:39  Glucose-Capillary Latest Ref Range: 70 - 99 mg/dL 324 (H) 401 (H) 027 (H) 204 (H) 209 (H) 186 (H) 149 (H)   Diabetes history: DM 1 Outpatient Diabetes medications:  Tresiba 30 units daily, Novolog 0-50 units tid with meals (followed by [facility] endocrinology)- per last office Visit he was instructed to do 1 unit for every 5 grams of CHO, 1 unit for every 30 mg/dL>150 mg/dL Current orders for Inpatient glycemic control:  Novolog moderate tid with meals and HS, Lantus 20 units q HS  Inpatient Diabetes Program Recommendations:   Results for Pearson, Jeffrey (MRN [id number]) as of 08/24/2018 11:35  Ref. Range 08/24/2018 05:33  Hemoglobin A1C Latest Ref Range: 4.8 - 5.6 % 13.1 (H)    Per chart review, A1C in December of 2019 was 9.5%.  A1C increased markedly since then.  Will speak with patient by phone to discuss potential reasons for increased A1C and potential needs related to DM.    -MD, please add Novolog 5 units tid with meals (hold if patient eats less than 50%).    Thanks,  Jeffrey Meager, RN, BC-ADM Inpatient Diabetes Coordinator Pager [phone] (8a-5p)

## 2018-08-24 NOTE — Procedures (Signed)
ELECTROENCEPHALOGRAM REPORT   Patient: Jeffrey Pearson       Room #: 250A-AA EEG No. ID: [id number] Age: 58 y.o.        Sex: male Referring Physician: Gouru Report Date:  08/24/2018        Interpreting Physician: Thana Farr  History: Jeffrey Pearson is an 58 y.o. male with seizure-like activity  Medications:  Lipitor, Biktarvy, Insulin, Synthroid, Valtrex  Conditions of Recording:  This is a 21 channel routine scalp EEG performed with bipolar and monopolar montages arranged in accordance to the international 10/20 system of electrode placement. One channel was dedicated to EKG recording.  The patient is in the awake, drowsy and asleep states.  Description:  The waking background activity consists of a low voltage, symmetrical, fairly well organized, 8 Hz alpha activity, seen from the parieto-occipital and posterior temporal regions.  Low voltage fast activity, poorly organized, is seen anteriorly and is at times superimposed on more posterior regions.  A mixture of theta and alpha rhythms are seen from the central and temporal regions. The patient drowses with slowing to irregular, low voltage theta and beta activity.   The patient goes in to a light sleep with symmetrical sleep spindles, vertex central sharp transients and irregular slow activity.  No epileptiform activity is noted.   Hyperventilation was not performed.  Intermittent photic stimulation was performed but failed to illicit any change in the tracing.     IMPRESSION: Normal electroencephalogram, awake, asleep and with activation procedures. There are no focal lateralizing or epileptiform features.   Thana Farr, MD Neurology [phone] 08/24/2018, 5:50 PM

## 2018-08-24 NOTE — Progress Notes (Signed)
To EEG via bed.

## 2018-08-24 NOTE — Progress Notes (Signed)
[facility] - [place] at [facility]   PATIENT NAME: Jeffrey Pearson    MR#:  [id number]  DATE OF BIRTH:  Jul 10, 1960  SUBJECTIVE:  CHIEF COMPLAINT: Patient is off the drip resting comfortably tolerating diet  no nausea vomiting  REVIEW OF SYSTEMS:  CONSTITUTIONAL: No fever, fatigue or weakness.  EYES: No blurred or double vision.  EARS, NOSE, AND THROAT: No tinnitus or ear pain.  RESPIRATORY: No cough, shortness of breath, wheezing or hemoptysis.  CARDIOVASCULAR: No chest pain, orthopnea, edema.  GASTROINTESTINAL: No nausea, vomiting, diarrhea or abdominal pain.  GENITOURINARY: No dysuria, hematuria.  ENDOCRINE: No polyuria, nocturia,  HEMATOLOGY: No anemia, easy bruising or bleeding SKIN: No rash or lesion. MUSCULOSKELETAL: No joint pain or arthritis.   NEUROLOGIC: No tingling, numbness, weakness.  PSYCHIATRY: No anxiety or depression.   DRUG ALLERGIES:   Allergies  Allergen Reactions  . Septra [Sulfamethoxazole-Trimethoprim] Rash    VITALS:  Blood pressure 125/76, pulse 88, temperature 98.1 F (36.7 C), temperature source Oral, resp. rate 17, height 5\' 9"  (1.753 m), weight 95.1 kg, SpO2 96 %.  PHYSICAL EXAMINATION:  GENERAL:  58 y.o.-year-old patient lying in the bed with no acute distress.  EYES: Pupils equal, round, reactive to light and accommodation. No scleral icterus. Extraocular muscles intact.  HEENT: Head atraumatic, normocephalic. Oropharynx and nasopharynx clear.  NECK:  Supple, no jugular venous distention. No thyroid enlargement, no tenderness.  LUNGS: Normal breath sounds bilaterally, no wheezing, rales,rhonchi or crepitation. No use of accessory muscles of respiration.  CARDIOVASCULAR: S1, S2 normal. No murmurs, rubs, or gallops.  ABDOMEN: Soft, nontender, nondistended. Bowel sounds present.  EXTREMITIES: No pedal edema, cyanosis, or clubbing.  NEUROLOGIC: Awake alert and oriented x3. Sensation intact. Gait not checked.   PSYCHIATRIC: The patient is alert and oriented x 3.  SKIN: No obvious rash, lesion, or ulcer.    LABORATORY PANEL:   CBC Recent Labs  Lab 08/24/18 0533  WBC 5.1  HGB 11.6*  HCT 33.0*  PLT 113*   ------------------------------------------------------------------------------------------------------------------  Chemistries  Recent Labs  Lab 08/23/18 1833  08/24/18 0533  NA  --    < > 135  K  --    < > 3.3*  CL  --    < > 99  CO2  --    < > 26  GLUCOSE  --    < > 168*  BUN  --    < > 15  CREATININE  --    < > 0.89  CALCIUM  --    < > 8.2*  MG 1.9  --   --   AST  --   --  37  ALT  --   --  46*  ALKPHOS  --   --  121  BILITOT  --   --  0.6   < > = values in this interval not displayed.   ------------------------------------------------------------------------------------------------------------------  Cardiac Enzymes Recent Labs  Lab 08/23/18 1833  TROPONINI <0.03   ------------------------------------------------------------------------------------------------------------------  RADIOLOGY:  Ct Head W Or Wo Contrast  Result Date: 08/23/2018 CLINICAL DATA:  Altered mental status EXAM: CT HEAD WITHOUT AND WITH CONTRAST TECHNIQUE: Contiguous axial images were obtained from the base of the skull through the vertex without and with intravenous contrast CONTRAST:  34mL OMNIPAQUE IOHEXOL 300 MG/ML  SOLN COMPARISON:  None. FINDINGS: Brain: There is no mass, hemorrhage or extra-axial collection. The size and configuration of the ventricles and extra-axial CSF spaces are normal. There is hypoattenuation of the white  matter, most commonly indicating chronic small vessel disease. No abnormal contrast enhancement. Vascular: No abnormal hyperdensity of the major intracranial arteries or dural venous sinuses. No intracranial atherosclerosis. Skull: The visualized skull base, calvarium and extracranial soft tissues are normal. Sinuses/Orbits: No fluid levels or advanced mucosal  thickening of the visualized paranasal sinuses. No mastoid or middle ear effusion. The orbits are normal. IMPRESSION: 1. No enhancing intracranial lesions. 2. Chronic microvascular ischemic changes of the white matter. 3. No acute intracranial abnormality. Electronically Signed   By: Deatra RobinsonKevin  Herman M.D.   On: 08/23/2018 19:35   Dg Chest Portable 1 View  Result Date: 08/23/2018 CLINICAL DATA:  Altered mental status. EXAM: PORTABLE CHEST 1 VIEW COMPARISON:  Chest radiograph 07/08/2018. CT chest 07/08/2018. FINDINGS: Heart size is stable and within normal limits. Masslike opacity in the RIGHT upper lobe is significantly improved, consistent with resolving pneumonia. Mild vascular congestion throughout both lung fields. No effusion or pneumothorax. Bones unremarkable. Previous thyroidectomy. IMPRESSION: Masslike opacity in the RIGHT upper lobe is significantly improved, consistent with resolving pneumonia. Electronically Signed   By: Elsie StainJohn T Curnes M.D.   On: 08/23/2018 19:26    EKG:   Orders placed or performed during the hospital encounter of 08/23/18  . EKG 12-Lead  . EKG 12-Lead    ASSESSMENT AND PLAN:    Hyperosmolar hyperglycemic state and type 1 diabetes- no signs of infection.  Patient states he has not checked his blood sugar in 2-3 weeks.  [facility]. -Off insulin drip -Check hemoglobin A1c -Transfer to floor for 08/24/18  Questionable seizure-like activity- noted in triage.  CT head with and without contrast is negative. -Ativan as needed for seizure-like activity -EEG ordered.  If shows no epileptiform activity and to convulsant therapy is not required and no further work-up needed -Neurology consult placed and Dr. Thad Rangereynolds is following -Negative UDS  Bradycardic episode- noted to be in the 20s initially with complete heart block, then spontaneously reverted back to sinus rhythm. Troponin negative. -Heart rate is  75-85, continue close monitoring -Cardiology consult if patient continues to have bradycardic episodes -Cardiac monitoring  AKI- likely secondary to dehydration. Resolved with IV fluids --Avoid nephrotoxic agents -Holding home lisinopril  Hypertension-blood pressures elevated in the ED -Holding home lisinopril -Hydralazine IV as needed  [facility]. Last CD4 count 439. -Continue home Biktarvy -Recheck CD4 count  Hypothyroidism- s/p thyroidectomy -Continue home Synthroid -Normal TSH  Hyperlipidemia-stable -Continue home Lipitor     All the records are reviewed and case discussed with Care Management/Social Workerr. Management plans discussed with the patient, he is in agreement.  CODE STATUS: fc   TOTAL TIME TAKING CARE OF THIS PATIENT: 35  minutes.   POSSIBLE D/C IN 1-2  DAYS, DEPENDING ON CLINICAL CONDITION.  Note: This dictation was prepared with Dragon dictation along with smaller phrase technology. Any transcriptional errors that result from this process are unintentional.   Ramonita LabAruna Telecia Larocque M.D on 08/24/2018 at 12:43 PM  Between 7am to 6pm - Pager - [phone] After 6pm go to [email or web address] - password [email or web address]  Fabio [facility]  Office  [phone]  CC: Primary care physician; Patient, No Pcp Per

## 2018-08-24 NOTE — Progress Notes (Signed)
Pt glucose is less than 250, will transition to Long-acting insulin and SSI.  Pt reports that he takes 30 units of Degludec insulin daily.  Given his appetite is not quite back to normal, will place on 20 units of Lantus and SSI.  2 hours after Lantus is given, will convert off of insulin drip.  Per protocol, he is being placed on D5 1/2 NS until insulin drip is converted off, once off insulin drip will place pt on NS infusion.  Will consult Diabetes Coordinator for further assistance in managing/titrating his insulin.  Diet as tolerated.   Harlon Ditty, AGACNP-BC [facility] Pager: [phone] Cell: [phone]

## 2018-08-24 NOTE — Consult Note (Signed)
Reason for Consult:Seizure like activity Referring Physician: Gouru  CC: Seizure like activity  HPI: Jeffrey Pearson is an 58 y.o. male with a known history of type 2 diabetes, HIV, hypothyroidism s/p thyroidectomy who presented to the ED with headaches and generalized weakness over the last 1 to 2 weeks.  He stated he "just has not been feeling like himself".  He has been taking his insulin as prescribed.  He has not checked his blood sugar in 2 to 3 weeks.  While in triage, he was noted to have an episode where his eyes were moving back and forth.  This episode lasted about 30 seconds.  He was also noted to possibly have some movement of his head to the right side.  Patient repotrs that he was aware of both events.  On arrival in the ED, he was noted to be bradycardic to the low 20s, likely in complete heart block.  He then reverted back to normal sinus rhythm spontaneously without any intervention.  Patient states he often becomes a little disoriented with slurred speech whenever his blood sugars got really low.  Patient feels he is at baseline this morning.     Past Medical History:  Diagnosis Date  . Depression   . Diabetes mellitus without complication (HCC)   . HIV (human immunodeficiency virus infection) (HCC)   . Hypertension   . Hyperthyroidism   . Thyroid disease     Past Surgical History:  Procedure Laterality Date  . lymph nodes  1982  . THYROIDECTOMY    . THYROIDECTOMY, PARTIAL  1980  . TONSILLECTOMY      Family history: Father with cancer, HTN and cataracts.  Mother with cataracts and HTN  Social History:  reports that he has never smoked. He has never used smokeless tobacco. He reports that he does not drink alcohol or use drugs.  Allergies  Allergen Reactions  . Septra [Sulfamethoxazole-Trimethoprim] Rash    Medications:  I have reviewed the patient's current medications. Prior to Admission:  Medications Prior to Admission  Medication Sig Dispense Refill Last  Dose  . atorvastatin (LIPITOR) 10 MG tablet Take 10 mg by mouth daily.   08/22/2018 at Unknown time  . azithromycin (ZITHROMAX Z-PAK) 250 MG tablet Take 2 tablets (500 mg) on  Day 1 and day 2,  followed by 1 tablet (250 mg) once daily on Days 3 through 7. 9 each 0 08/22/2018 at Unknown time  . bictegravir-emtricitabine-tenofovir AF (BIKTARVY) 50-200-25 MG TABS tablet Take 1 tablet by mouth daily.     . citalopram (CELEXA) 40 MG tablet Take 40 mg by mouth daily.     . DULoxetine (CYMBALTA) 30 MG capsule Take 30 mg by mouth daily.   08/22/2018 at Unknown time  . HYDROcodone-acetaminophen (NORCO/VICODIN) 5-325 MG tablet Take 1 tablet by mouth every 4 (four) hours as needed. 15 tablet 0 08/22/2018 at Unknown time  . insulin aspart (NOVOLOG FLEXPEN) 100 UNIT/ML FlexPen Inject 0-50 Units into the skin daily.     . Insulin Degludec (TRESIBA) 100 UNIT/ML SOLN Inject 30 Units into the skin daily.     Marland Kitchen. levothyroxine (SYNTHROID) 75 MCG tablet Take 75 mcg by mouth daily before breakfast.   08/22/2018 at Unknown time  . lisinopril (ZESTRIL) 10 MG tablet Take 10 mg by mouth daily.   08/22/2018 at Unknown time  . traZODone (DESYREL) 100 MG tablet Take 200 mg by mouth Nightly.     . valACYclovir (VALTREX) 1000 MG tablet Take 50 mg by mouth  daily.   08/22/2018 at Unknown time   Scheduled: . atorvastatin  10 mg Oral Daily  . bictegravir-emtricitabine-tenofovir AF  1 tablet Oral Daily  . DULoxetine  30 mg Oral Daily  . enoxaparin (LOVENOX) injection  40 mg Subcutaneous Q24H  . insulin aspart  0-15 Units Subcutaneous TID WC  . insulin aspart  0-5 Units Subcutaneous QHS  . insulin glargine  20 Units Subcutaneous Daily  . levothyroxine  75 mcg Oral QAC breakfast  . valACYclovir  500 mg Oral Daily    ROS: History obtained from the patient  General ROS: fatigue Psychological ROS: negative for - behavioral disorder, hallucinations, memory difficulties, mood swings or suicidal ideation Ophthalmic ROS: negative for -  blurry vision, double vision, eye pain or loss of vision ENT ROS: negative for - epistaxis, nasal discharge, oral lesions, sore throat, tinnitus or vertigo Allergy and Immunology ROS: negative for - hives or itchy/watery eyes Hematological and Lymphatic ROS: negative for - bleeding problems, bruising or swollen lymph nodes Endocrine ROS: negative for - galactorrhea, hair pattern changes, polydipsia/polyuria or temperature intolerance Respiratory ROS: negative for - cough, hemoptysis, shortness of breath or wheezing Cardiovascular ROS: negative for - chest pain, dyspnea on exertion, edema or irregular heartbeat Gastrointestinal ROS: negative for - abdominal pain, diarrhea, hematemesis, nausea/vomiting or stool incontinence Genito-Urinary ROS: negative for - dysuria, hematuria, incontinence or urinary frequency/urgency Musculoskeletal ROS: negative for - joint swelling or muscular weakness Neurological ROS: as noted in HPI Dermatological ROS: negative for rash and skin lesion changes  Physical Examination: Blood pressure 125/76, pulse 88, temperature 98.1 F (36.7 C), temperature source Oral, resp. rate 17, height  (1.753 m), weight 95.1 kg, SpO2 96 %.  HEENT-  Normocephalic, no lesions, without obvious abnormality.  Normal external eye and conjunctiva.  Normal TM's bilaterally.  Normal auditory canals and external ears. Normal external nose, mucus membranes and septum.  Normal pharynx. Cardiovascular- S1, S2 normal, pulses palpable throughout   Lungs- chest clear, no wheezing, rales, normal symmetric air entry Abdomen- soft, non-tender; bowel sounds normal; no masses,  no organomegaly Extremities- no edema Lymph-no adenopathy palpable Musculoskeletal-no joint tenderness, deformity or swelling Skin-warm and dry, no hyperpigmentation, vitiligo, or suspicious lesions  Neurological Examination   Mental Status: Alert, oriented, thought content appropriate.  Speech fluent without evidence  of aphasia.  Able to follow 3 step commands without difficulty. Cranial Nerves: II: Discs flat bilaterally; Visual fields grossly normal, pupils equal, round, reactive to light and accommodation III,IV, VI: ptosis not present, extra-ocular motions intact bilaterally V,VII: smile symmetric, facial light touch sensation normal bilaterally VIII: hearing normal bilaterally IX,X: gag reflex present XI: bilateral shoulder shrug XII: midline tongue extension Motor: Right : Upper extremity   5/5    Left:     Upper extremity   5/5  Lower extremity   5/5     Lower extremity   5/5 Tone and bulk:normal tone throughout; no atrophy noted Sensory: Pinprick and light touch intact throughout, bilaterally Deep Tendon Reflexes: Symmetric throughout Plantars: Right: mute   Left: mute Cerebellar: Normal finger-to-nose and normal heel-to-shin testing bilaterally Gait: not tested due to safety concerns   Laboratory Studies:   Basic Metabolic Panel: Recent Labs  Lab 08/23/18 1829 08/23/18 1833 08/23/18 2208 08/24/18 0136 08/24/18 0533  NA 122*  --  129* 134* 135  K 5.2*  --  4.9 3.6 3.3*  CL 85*  --  90* 101 99  CO2 23  --  GLUCOSE 788*  --  517* 304* 168*  BUN 21*  --  20 16 15   CREATININE 1.27*  --  1.11 0.96 0.89  CALCIUM 8.9  --  8.9 8.3* 8.2*  MG  --  1.9  --   --   --     Liver Function Tests: Recent Labs  Lab 08/23/18 1829 08/24/18 0533  AST 57* 37  ALT 62* 46*  ALKPHOS 183* 121  BILITOT 0.8 0.6  PROT 8.0 6.1*  ALBUMIN 4.1 3.2*   No results for input(s): LIPASE, AMYLASE in the last 168 hours. No results for input(s): AMMONIA in the last 168 hours.  CBC: Recent Labs  Lab 08/23/18 1829 08/24/18 0533  WBC 4.7 5.1  NEUTROABS 3.9  --   HGB 13.2 11.6*  HCT 38.1* 33.0*  MCV 93.2 92.2  PLT 113* 113*    Cardiac Enzymes: Recent Labs  Lab 08/23/18 1833  TROPONINI <0.03    BNP: Invalid input(s): POCBNP  CBG: Recent Labs  Lab 08/24/18 0138 08/24/18 0252  08/24/18 0409 08/24/18 0517 08/24/18 0739  GLUCAP 279* 204* 209* 186* 149*    Microbiology: Results for orders placed or performed during the hospital encounter of 08/23/18  MRSA PCR Screening     Status: None   Collection Time: 08/23/18 10:13 PM  Result Value Ref Range Status   MRSA by PCR NEGATIVE NEGATIVE Final    Comment:        The GeneXpert MRSA Assay (FDA approved for NASAL specimens only), is one component of a comprehensive MRSA colonization surveillance program. It is not intended to diagnose MRSA infection nor to guide or monitor treatment for MRSA infections. Performed at [facility], [address] [place], [place] [address]     Coagulation Studies: No results for input(s): LABPROT, INR in the last 72 hours.  Urinalysis:  Recent Labs  Lab 08/23/18 1820  COLORURINE COLORLESS*  LABSPEC 1.021  PHURINE 7.0  GLUCOSEU >=500*  HGBUR NEGATIVE  BILIRUBINUR NEGATIVE  KETONESUR NEGATIVE  PROTEINUR NEGATIVE  NITRITE NEGATIVE  LEUKOCYTESUR NEGATIVE    Lipid Panel:  No results found for: CHOL, TRIG, HDL, CHOLHDL, VLDL, LDLCALC  HgbA1C:  Lab Results  Component Value Date   HGBA1C 13.1 (H) 08/24/2018    Urine Drug Screen:      Component Value Date/Time   LABOPIA NONE DETECTED 08/23/2018 1820   COCAINSCRNUR NONE DETECTED 08/23/2018 1820   LABBENZ NONE DETECTED 08/23/2018 1820   AMPHETMU NONE DETECTED 08/23/2018 1820   THCU NONE DETECTED 08/23/2018 1820   LABBARB NONE DETECTED 08/23/2018 1820    Alcohol Level: No results for input(s): ETH in the last 168 hours.  Other results: EKG: sinus rhythm at 93 bpm.  Imaging: Ct Head W Or Wo Contrast  Result Date: 08/23/2018 CLINICAL DATA:  Altered mental status EXAM: CT HEAD WITHOUT AND WITH CONTRAST TECHNIQUE: Contiguous axial images were obtained from the base of the skull through the vertex without and with intravenous contrast CONTRAST:  47mL OMNIPAQUE IOHEXOL 300 MG/ML  SOLN COMPARISON:   None. FINDINGS: Brain: There is no mass, hemorrhage or extra-axial collection. The size and configuration of the ventricles and extra-axial CSF spaces are normal. There is hypoattenuation of the white matter, most commonly indicating chronic small vessel disease. No abnormal contrast enhancement. Vascular: No abnormal hyperdensity of the major intracranial arteries or dural venous sinuses. No intracranial atherosclerosis. Skull: The visualized skull base, calvarium and extracranial soft tissues are normal. Sinuses/Orbits: No fluid levels or advanced mucosal thickening of the visualized paranasal  sinuses. No mastoid or middle ear effusion. The orbits are normal. IMPRESSION: 1. No enhancing intracranial lesions. 2. Chronic microvascular ischemic changes of the white matter. 3. No acute intracranial abnormality. Electronically Signed   By: Deatra Robinson M.D.   On: 08/23/2018 19:35   Dg Chest Portable 1 View  Result Date: 08/23/2018 CLINICAL DATA:  Altered mental status. EXAM: PORTABLE CHEST 1 VIEW COMPARISON:  Chest radiograph 07/08/2018. CT chest 07/08/2018. FINDINGS: Heart size is stable and within normal limits. Masslike opacity in the RIGHT upper lobe is significantly improved, consistent with resolving pneumonia. Mild vascular congestion throughout both lung fields. No effusion or pneumothorax. Bones unremarkable. Previous thyroidectomy. IMPRESSION: Masslike opacity in the RIGHT upper lobe is significantly improved, consistent with resolving pneumonia. Electronically Signed   By: Elsie Stain M.D.   On: 08/23/2018 19:26     Assessment/Plan: 58 year old male presenting with elevated blood sugars and multiple other metabolic abnormalities.  Concern for some activity that was felt to be suggestive of seizures.  Patient reports no previous history of seizures.  Is at baseline today with a nonfocal neurological examination.  Head CT reviewed and shows no acute changes.  Abnormal presentation likely metabolic  in etiology.    Recommendations: 1.  EEG.  If shows no epileptiform activity, anticonvulsant therapy not indicated and no further neurological work up required.    Thana Farr, MD Neurology [phone] 08/24/2018, 11:40 AM

## 2018-08-24 NOTE — Progress Notes (Signed)
Back from EEG 

## 2018-08-24 NOTE — Progress Notes (Signed)
Diabetes history: DM 1 Outpatient Diabetes medications:  Tresiba 30 units daily, Novolog 0-50 units tid with meals (followed by [facility] endocrinology)- per last office Visit he was instructed to do 1 unit for every 5 grams of CHO, 1 unit for every 30 mg/dL>150 mg/dL Current orders for Inpatient glycemic control:  Novolog moderate tid with meals and HS, Lantus 20 units q HS  Spoke with patient by phone regarding admission and DM management. A1C increased to 13.1%. Patient was not surprised by this.  We discussed potential reasons for this.  He admits that he was rationing insulin at times and not taking Novolog as consistently as needed.  We discussed that Evaristo Bury is a "flat" basal insulin, therefore requiring diligent coverage of food, etc. With Novolog.  We briefly discussed CHO coverage.  Patient states that he has not been CHO counting.  We discussed idea of 1 unit for every 5 grams of CHO (so to cover 15 grams of CHO this would require 3 units of insulin).  Patient very engaged and thankful for information. Needs close fu with endocrinology when possible.  He does have rx. For both Dexcom and [employer].  Discussed both products and sent patient link for "FreeStyle Libre" patient instructions. Will be glad to assist as needed. Of note patient does receive assistance for both Tresiba and Novolog so that there is 0$ copay.  Discussed importance of compliance and patient agreed.   Thanks  Beryl Meager, RN, BC-ADM Inpatient Diabetes Coordinator Pager [phone] (8a-5p)

## 2018-08-25 LAB — GASTROINTESTINAL PANEL BY PCR, STOOL (REPLACES STOOL CULTURE)

## 2018-08-25 LAB — GLUCOSE, CAPILLARY: Glucose-Capillary: 223 mg/dL — ABNORMAL HIGH (ref 70–99)

## 2018-08-25 LAB — BASIC METABOLIC PANEL
Anion gap: 5 (ref 5–15)
BUN: 13 mg/dL (ref 6–20)
CO2: 26 mmol/L (ref 22–32)
Calcium: 8.3 mg/dL — ABNORMAL LOW (ref 8.9–10.3)
Chloride: 100 mmol/L (ref 98–111)
Creatinine, Ser: 0.88 mg/dL (ref 0.61–1.24)
GFR calc Af Amer: >60 mL/min (ref >60)
GFR calc non Af Amer: >60 mL/min (ref >60)
Glucose, Bld: 243 mg/dL — ABNORMAL HIGH (ref 70–99)
Potassium: 4 mmol/L (ref 3.5–5.1)
Sodium: 131 mmol/L — ABNORMAL LOW (ref 135–145)

## 2018-08-25 LAB — HELPER T-LYMPH-CD4 (ARMC ONLY)
% CD 4 Pos. Lymph.: 30.2 % — ABNORMAL LOW (ref 30.8–58.5)
Absolute CD 4 Helper: 211 /uL — ABNORMAL LOW (ref 359–1519)
Basophils Absolute: 0 10*3/uL (ref 0.0–0.2)
Basos: 1 %
EOS (ABSOLUTE): 0 10*3/uL (ref 0.0–0.4)
Eos: 1 %
Hematocrit: 38.7 % (ref 37.5–51.0)
Hemoglobin: 13 g/dL (ref 13.0–17.7)
Immature Grans (Abs): 0 10*3/uL (ref 0.0–0.1)
Immature Granulocytes: 0 %
Lymphocytes Absolute: 0.7 10*3/uL (ref 0.7–3.1)
Lymphs: 17 %
MCH: 31.3 pg (ref 26.6–33.0)
MCHC: 33.6 g/dL (ref 31.5–35.7)
MCV: 93 fL (ref 79–97)
Monocytes Absolute: 0.1 10*3/uL (ref 0.1–0.9)
Monocytes: 3 %
Neutrophils Absolute: 3.3 10*3/uL (ref 1.4–7.0)
Neutrophils: 78 %
Platelets: 121 10*3/uL — ABNORMAL LOW (ref 150–450)
RBC: 4.15 x10E6/uL (ref 4.14–5.80)
RDW: 12.6 % (ref 11.6–15.4)
WBC: 4.2 10*3/uL (ref 3.4–10.8)

## 2018-08-25 LAB — C DIFFICILE QUICK SCREEN W PCR REFLEX??: C Diff toxin: NEGATIVE

## 2018-08-25 LAB — C DIFFICILE QUICK SCREEN W PCR REFLEX
C Diff antigen: NEGATIVE
C Diff interpretation: NOT DETECTED

## 2018-08-25 MED ORDER — BACID PO TABS
2.0000 | ORAL_TABLET | Freq: Three times a day (TID) | ORAL | Status: DC
  Filled 2018-08-25: qty 2

## 2018-08-25 MED ORDER — DIPHENOXYLATE-ATROPINE 2.5-0.025 MG PO TABS
1.0000 | ORAL_TABLET | Freq: Four times a day (QID) | ORAL | Status: DC | PRN
  Administered 2018-08-25: 04:00:00 1 via ORAL
  Filled 2018-08-25: qty 1

## 2018-08-25 MED ORDER — RISAQUAD PO CAPS
2.0000 | ORAL_CAPSULE | Freq: Three times a day (TID) | ORAL | Status: DC
  Administered 2018-08-25: 10:00:00 2 via ORAL
  Filled 2018-08-25: qty 2

## 2018-08-25 NOTE — Progress Notes (Signed)
Summoned to pt's room. Told he was going to sign himself out. "that nothing had been done for him. And he has seen a doctor since he arrived. " I pointed out note writtenby dr. Amado Coe yesterday. offered to page her and have her see him. Pt refused .   He was will ing to sign a.m.a form.

## 2018-08-25 NOTE — Plan of Care (Signed)
  Problem: Education: Goal: Knowledge of General Education information will improve Description: Including pain rating scale, medication(s)/side effects and non-pharmacologic comfort measures Outcome: Progressing   Problem: Clinical Measurements: Goal: Ability to maintain clinical measurements within normal limits will improve Outcome: Progressing Goal: Respiratory complications will improve Outcome: Progressing   Problem: Activity: Goal: Risk for activity intolerance will decrease Outcome: Progressing   

## 2018-08-25 NOTE — Discharge Summary (Signed)
AGAINST MEDICAL ADVICE  Date of admission 08/23/2018 Left hospital AGAINST MEDICAL ADVICE on 08/25/2018   HPI  Jeffrey Pearson  is a 58 y.o. male with a known history of type 2 diabetes, HIV, hypothyroidism s/p thyroidectomy who presented to the ED with headaches and generalized weakness over the last 1 to 2 weeks.  He states he "just has not been feeling like himself".  He has been taking his insulin as prescribed.  He has not checked his blood sugar in 2 to 3 weeks.  While in triage, he was noted to have an episode where his eyes were moving back and forth.  This episode lasted about 30 seconds.  He was also noted to possibly have some movement of his head to the right side.  On arrival to the ED, he was noted to be bradycardic to the low 20s, likely in complete heart block.  He then reverted back to normal sinus rhythm spontaneously without any intervention.  Patient states he often becomes a little disoriented with slurred speech whenever his blood sugars got really low.  After his initial episode of bradycardia, heart rates were in the 80s to 90s.  Abs were significant for glucose 788, creatinine 1.27, normal anion gap.  UA was unremarkable.  Chest x-ray is unremarkable.  CT head with and without contrast were unremarkable.  Patient was started on insulin drip.  Hospitalists were called for admission.   Hyperosmolar hyperglycemic state and type1diabetes-no signs of infection. Patient states he has not checkedhis blood sugar in 2-3 weeks.  Outpatient follows with his primary endocrinologist at [facility] endocrinology. -Off insulin drip - hemoglobin A1c 13.1 -Transfered to floor for 08/24/18 -Patient left hospital AGAINST MEDICAL ADVICE on 08/25/2018 and before I saw him on 25th  -Questionable seizure-like activity-noted in triage. CT head with and without contrast isnegative. -Ativan as needed for seizure-like activity -EEG ordered.  If shows no epileptiform activity and to convulsant therapy is  not required and no further work-up needed -Neurology consult placed and Dr. Thad Ranger is following -NegativeUDS  Bradycardic episode- noted to be in the 20s initially with complete heart block, then spontaneously reverted back to sinus rhythm. Troponin negative. -Heart rate is 75-85, continue close monitoring -Cardiology consult if patient continues to have bradycardic episodes -Cardiac monitoring  AKI- likely secondary to dehydration. Resolved with IV fluids --Avoid nephrotoxic agents -Holding home lisinopril  Hypertension-blood pressures elevated in the ED -Holding home lisinopril -Hydralazine IV as needed  HIV-follows at [facility]. Last CD4 count 439. -Continue home Biktarvy -Recheck CD4 count  Hypothyroidism- s/p thyroidectomy -Continue home Synthroid -Normal TSH  Hyperlipidemia-stable -Continue home Lipitor

## 2018-08-25 NOTE — Progress Notes (Signed)
Subjective: Patient awake and alert.  No further seizure like activity.    Objective: Current vital signs: BP (!) 142/80 (BP Location: Left Arm)   Pulse 73   Temp 98 F (36.7 C) (Oral)   Resp 20   Ht 5\' 9"  (1.753 m)   Wt 94.9 kg   SpO2 97%   BMI 30.89 kg/m  Vital signs in last 24 hours: Temp:  [98 F (36.7 C)-99.2 F (37.3 C)] 98 F (36.7 C) (04/25 0808) Pulse Rate:  [73-87] 73 (04/25 0808) Resp:  [18-20] 20 (04/25 0808) BP: (133-150)/(68-94) 142/80 (04/25 0808) SpO2:  [97 %-99 %] 97 % (04/25 0808) Weight:  [94.9 kg-96.8 kg] 94.9 kg (04/25 0445)  Intake/Output from previous day: 04/24 0701 - 04/25 0700 In: 1144.6 [P.O.:880; I.V.:264.6] Out: 0  Intake/Output this shift: No intake/output data recorded. Nutritional status:  Diet Order            Diet Carb Modified Fluid consistency: Thin; Room service appropriate? Yes  Diet effective now              Neurologic Exam: Mental Status: Alert, oriented, thought content appropriate.  Speech fluent without evidence of aphasia.  Able to follow 3 step commands without difficulty. Cranial Nerves: II: Discs flat bilaterally; Visual fields grossly normal, pupils equal, round, reactive to light and accommodation III,IV, VI: ptosis not present, extra-ocular motions intact bilaterally V,VII: smile symmetric, facial light touch sensation normal bilaterally VIII: hearing normal bilaterally IX,X: gag reflex present XI: bilateral shoulder shrug XII: midline tongue extension Motor: 5/5 throughout Sensory: Pinprick and light touch intact throughout, bilaterally  Lab Results: Basic Metabolic Panel: Recent Labs  Lab 08/23/18 1829 08/23/18 1833 08/23/18 2208 08/24/18 0136 08/24/18 0533 08/25/18 0625  NA 122*  --  129* 134* 135 131*  K 5.2*  --  4.9 3.6 3.3* 4.0  CL 85*  --  90* 101 99 100  CO2 23  --  27 27 26 26   GLUCOSE 788*  --  517* 304* 168* 243*  BUN 21*  --  20 16 15 13   CREATININE 1.27*  --  1.11 0.96 0.89 0.88   CALCIUM 8.9  --  8.9 8.3* 8.2* 8.3*  MG  --  1.9  --   --   --   --     Liver Function Tests: Recent Labs  Lab 08/23/18 1829 08/24/18 0533  AST 57* 37  ALT 62* 46*  ALKPHOS 183* 121  BILITOT 0.8 0.6  PROT 8.0 6.1*  ALBUMIN 4.1 3.2*   No results for input(s): LIPASE, AMYLASE in the last 168 hours. No results for input(s): AMMONIA in the last 168 hours.  CBC: Recent Labs  Lab 08/23/18 1829 08/24/18 0533  WBC 4.7 5.1  NEUTROABS 3.9  --   HGB 13.2 11.6*  HCT 38.1* 33.0*  MCV 93.2 92.2  PLT 113* 113*    Cardiac Enzymes: Recent Labs  Lab 08/23/18 1833  TROPONINI <0.03    Lipid Panel: No results for input(s): CHOL, TRIG, HDL, CHOLHDL, VLDL, LDLCALC in the last 168 hours.  CBG: Recent Labs  Lab 08/24/18 0739 08/24/18 1216 08/24/18 1727 08/24/18 2032 08/25/18 0800  GLUCAP 149* 280* 285* 315* 223*    Microbiology: Results for orders placed or performed during the hospital encounter of 08/23/18  MRSA PCR Screening     Status: None   Collection Time: 08/23/18 10:13 PM  Result Value Ref Range Status   MRSA by PCR NEGATIVE NEGATIVE Final  Comment:        The GeneXpert MRSA Assay (FDA approved for NASAL specimens only), is one component of a comprehensive MRSA colonization surveillance program. It is not intended to diagnose MRSA infection nor to guide or monitor treatment for MRSA infections. Performed at [facility], [address] [place], [place] [address]   Gastrointestinal Panel by PCR , Stool     Status: None   Collection Time: 08/25/18  1:17 AM  Result Value Ref Range Status   Campylobacter species NOT DETECTED NOT DETECTED Final   Plesimonas shigelloides NOT DETECTED NOT DETECTED Final   Salmonella species NOT DETECTED NOT DETECTED Final   Yersinia enterocolitica NOT DETECTED NOT DETECTED Final   Vibrio species NOT DETECTED NOT DETECTED Final   Vibrio cholerae NOT DETECTED NOT DETECTED Final   Enteroaggregative E coli (EAEC)  NOT DETECTED NOT DETECTED Final   Enteropathogenic E coli (EPEC) NOT DETECTED NOT DETECTED Final   Enterotoxigenic E coli (ETEC) NOT DETECTED NOT DETECTED Final   Shiga like toxin producing E coli (STEC) NOT DETECTED NOT DETECTED Final   Shigella/Enteroinvasive E coli (EIEC) NOT DETECTED NOT DETECTED Final   Cryptosporidium NOT DETECTED NOT DETECTED Final   Cyclospora cayetanensis NOT DETECTED NOT DETECTED Final   Entamoeba histolytica NOT DETECTED NOT DETECTED Final   Giardia lamblia NOT DETECTED NOT DETECTED Final   Adenovirus F40/41 NOT DETECTED NOT DETECTED Final   Astrovirus NOT DETECTED NOT DETECTED Final   Norovirus GI/GII NOT DETECTED NOT DETECTED Final   Rotavirus A NOT DETECTED NOT DETECTED Final   Sapovirus (I, II, IV, and V) NOT DETECTED NOT DETECTED Final    Comment: Performed at [facility], [address] [place], [place] [address]  C Difficile Quick Screen w PCR reflex     Status: None   Collection Time: 08/25/18  1:17 AM  Result Value Ref Range Status   C Diff antigen NEGATIVE NEGATIVE Final   C Diff toxin NEGATIVE NEGATIVE Final   C Diff interpretation No C. difficile detected.  Final    Comment: Performed at [facility], [address] [place], [place] [address]    Coagulation Studies: No results for input(s): LABPROT, INR in the last 72 hours.  Imaging: Ct Head W Or Wo Contrast  Result Date: 08/23/2018 CLINICAL DATA:  Altered mental status EXAM: CT HEAD WITHOUT AND WITH CONTRAST TECHNIQUE: Contiguous axial images were obtained from the base of the skull through the vertex without and with intravenous contrast CONTRAST:  33mL OMNIPAQUE IOHEXOL 300 MG/ML  SOLN COMPARISON:  None. FINDINGS: Brain: There is no mass, hemorrhage or extra-axial collection. The size and configuration of the ventricles and extra-axial CSF spaces are normal. There is hypoattenuation of the white matter, most commonly indicating chronic small vessel disease. No abnormal  contrast enhancement. Vascular: No abnormal hyperdensity of the major intracranial arteries or dural venous sinuses. No intracranial atherosclerosis. Skull: The visualized skull base, calvarium and extracranial soft tissues are normal. Sinuses/Orbits: No fluid levels or advanced mucosal thickening of the visualized paranasal sinuses. No mastoid or middle ear effusion. The orbits are normal. IMPRESSION: 1. No enhancing intracranial lesions. 2. Chronic microvascular ischemic changes of the white matter. 3. No acute intracranial abnormality. Electronically Signed   By: Deatra Robinson M.D.   On: 08/23/2018 19:35   Dg Chest Portable 1 View  Result Date: 08/23/2018 CLINICAL DATA:  Altered mental status. EXAM: PORTABLE CHEST 1 VIEW COMPARISON:  Chest radiograph 07/08/2018. CT chest 07/08/2018. FINDINGS: Heart  size is stable and within normal limits. Masslike opacity in the RIGHT upper lobe is significantly improved, consistent with resolving pneumonia. Mild vascular congestion throughout both lung fields. No effusion or pneumothorax. Bones unremarkable. Previous thyroidectomy. IMPRESSION: Masslike opacity in the RIGHT upper lobe is significantly improved, consistent with resolving pneumonia. Electronically Signed   By: Elsie Stain M.D.   On: 08/23/2018 19:26    Medications:  I have reviewed the patient's current medications. Scheduled: . acidophilus  2 capsule Oral TID  . atorvastatin  10 mg Oral Daily  . bictegravir-emtricitabine-tenofovir AF  1 tablet Oral Daily  . DULoxetine  30 mg Oral Daily  . enoxaparin (LOVENOX) injection  40 mg Subcutaneous Q24H  . insulin aspart  0-15 Units Subcutaneous TID WC  . insulin aspart  0-5 Units Subcutaneous QHS  . insulin aspart  5 Units Subcutaneous TID WC  . insulin glargine  20 Units Subcutaneous Daily  . levothyroxine  75 mcg Oral QAC breakfast  . valACYclovir  500 mg Oral Daily    Assessment/Plan: Patient stable neurologically.  No further seizure like  activity.  EEG shows no epileptiform activity.  Anticonvulsant therapy not indicated at this time.    Recommendations: 1. No further neurologic intervention is recommended at this time.  If further questions arise, please call or page at that time.  Thank you for allowing neurology to participate in the care of this patient.    LOS: 2 days   Thana Farr, MD Neurology [phone] 08/25/2018  9:31 AM

## 2018-09-21 ENCOUNTER — Encounter: Payer: Self-pay | Admitting: Emergency Medicine

## 2018-09-21 ENCOUNTER — Observation Stay
Admission: EM | Admit: 2018-09-21 | Discharge: 2018-09-22 | Disposition: A | Discharge status: Home / Self Care | Payer: Self-pay | Attending: Internal Medicine | Admitting: Internal Medicine

## 2018-09-21 ENCOUNTER — Other Ambulatory Visit: Payer: Self-pay

## 2018-09-21 ENCOUNTER — Emergency Department: Payer: Self-pay

## 2018-09-21 ENCOUNTER — Observation Stay: Payer: Self-pay

## 2018-09-21 DIAGNOSIS — E119 Type 2 diabetes mellitus without complications: Secondary | ICD-10-CM | POA: Insufficient documentation

## 2018-09-21 DIAGNOSIS — G459 Transient cerebral ischemic attack, unspecified: Principal | ICD-10-CM | POA: Insufficient documentation

## 2018-09-21 DIAGNOSIS — Z794 Long term (current) use of insulin: Secondary | ICD-10-CM | POA: Insufficient documentation

## 2018-09-21 DIAGNOSIS — Z7902 Long term (current) use of antithrombotics/antiplatelets: Secondary | ICD-10-CM | POA: Insufficient documentation

## 2018-09-21 DIAGNOSIS — I10 Essential (primary) hypertension: Secondary | ICD-10-CM | POA: Insufficient documentation

## 2018-09-21 DIAGNOSIS — B20 Human immunodeficiency virus [HIV] disease: Secondary | ICD-10-CM | POA: Insufficient documentation

## 2018-09-21 DIAGNOSIS — Z79899 Other long term (current) drug therapy: Secondary | ICD-10-CM | POA: Insufficient documentation

## 2018-09-21 DIAGNOSIS — Z1159 Encounter for screening for other viral diseases: Secondary | ICD-10-CM | POA: Insufficient documentation

## 2018-09-21 DIAGNOSIS — E0590 Thyrotoxicosis, unspecified without thyrotoxic crisis or storm: Secondary | ICD-10-CM | POA: Insufficient documentation

## 2018-09-21 DIAGNOSIS — F329 Major depressive disorder, single episode, unspecified: Secondary | ICD-10-CM | POA: Insufficient documentation

## 2018-09-21 DIAGNOSIS — E039 Hypothyroidism, unspecified: Secondary | ICD-10-CM | POA: Insufficient documentation

## 2018-09-21 DIAGNOSIS — Z79890 Hormone replacement therapy (postmenopausal): Secondary | ICD-10-CM | POA: Insufficient documentation

## 2018-09-21 DIAGNOSIS — I6381 Other cerebral infarction due to occlusion or stenosis of small artery: Secondary | ICD-10-CM | POA: Insufficient documentation

## 2018-09-21 LAB — LIPID PANEL
Cholesterol: 191 mg/dL (ref 0–200)
HDL: 69 mg/dL (ref >40)
LDL Cholesterol: 82 mg/dL (ref 0–99)
Total CHOL/HDL Ratio: 2.8 RATIO
Triglycerides: 198 mg/dL — ABNORMAL HIGH (ref <150)
VLDL: 40 mg/dL (ref 0–40)

## 2018-09-21 LAB — COMPREHENSIVE METABOLIC PANEL
ALT: 31 U/L (ref 0–44)
AST: 33 U/L (ref 15–41)
Albumin: 3.2 g/dL — ABNORMAL LOW (ref 3.5–5.0)
Alkaline Phosphatase: 114 U/L (ref 38–126)
Anion gap: 9 (ref 5–15)
BUN: 16 mg/dL (ref 6–20)
CO2: 24 mmol/L (ref 22–32)
Calcium: 8 mg/dL — ABNORMAL LOW (ref 8.9–10.3)
Chloride: 95 mmol/L — ABNORMAL LOW (ref 98–111)
Creatinine, Ser: 1.13 mg/dL (ref 0.61–1.24)
GFR calc Af Amer: >60 mL/min (ref >60)
GFR calc non Af Amer: >60 mL/min (ref >60)
Glucose, Bld: 264 mg/dL — ABNORMAL HIGH (ref 70–99)
Potassium: 4.2 mmol/L (ref 3.5–5.1)
Sodium: 128 mmol/L — ABNORMAL LOW (ref 135–145)
Total Bilirubin: 0.6 mg/dL (ref 0.3–1.2)
Total Protein: 6.3 g/dL — ABNORMAL LOW (ref 6.5–8.1)

## 2018-09-21 LAB — SARS CORONAVIRUS 2 BY RT PCR (HOSPITAL ORDER, PERFORMED IN ~~LOC~~ HOSPITAL LAB): SARS Coronavirus 2: NEGATIVE

## 2018-09-21 LAB — GLUCOSE, CAPILLARY
Glucose-Capillary: 286 mg/dL — ABNORMAL HIGH (ref 70–99)
Glucose-Capillary: 398 mg/dL — ABNORMAL HIGH (ref 70–99)
Glucose-Capillary: 266 mg/dL — ABNORMAL HIGH (ref 70–99)
Glucose-Capillary: 360 mg/dL — ABNORMAL HIGH (ref 70–99)
Glucose-Capillary: 258 mg/dL — ABNORMAL HIGH (ref 70–99)

## 2018-09-21 LAB — URINE DRUG SCREEN, QUALITATIVE (ARMC ONLY)
Amphetamines, Ur Screen: NOT DETECTED
Barbiturates, Ur Screen: NOT DETECTED
Benzodiazepine, Ur Scrn: NOT DETECTED
Cannabinoid 50 Ng, Ur ~~LOC~~: NOT DETECTED
Cocaine Metabolite,Ur ~~LOC~~: NOT DETECTED
MDMA (Ecstasy)Ur Screen: NOT DETECTED
Methadone Scn, Ur: NOT DETECTED
Opiate, Ur Screen: NOT DETECTED
Phencyclidine (PCP) Ur S: NOT DETECTED
Tricyclic, Ur Screen: NOT DETECTED

## 2018-09-21 LAB — URINALYSIS, ROUTINE W REFLEX MICROSCOPIC
Bacteria, UA: NONE SEEN
Bilirubin Urine: NEGATIVE
Glucose, UA: >=500 mg/dL — AB
Hgb urine dipstick: NEGATIVE
Ketones, ur: NEGATIVE mg/dL
Leukocytes,Ua: NEGATIVE
Nitrite: NEGATIVE
Protein, ur: NEGATIVE mg/dL
Specific Gravity, Urine: 1.021 (ref 1.005–1.030)
pH: 6 (ref 5.0–8.0)

## 2018-09-21 LAB — DIFFERENTIAL
Abs Immature Granulocytes: 0.02 10*3/uL (ref 0.00–0.07)
Basophils Absolute: 0.1 10*3/uL (ref 0.0–0.1)
Basophils Relative: 1 %
Eosinophils Absolute: 0.3 10*3/uL (ref 0.0–0.5)
Eosinophils Relative: 9 %
Immature Granulocytes: 1 %
Lymphocytes Relative: 52 %
Lymphs Abs: 1.8 10*3/uL (ref 0.7–4.0)
Monocytes Absolute: 0.3 10*3/uL (ref 0.1–1.0)
Monocytes Relative: 7 %
Neutro Abs: 1.1 10*3/uL — ABNORMAL LOW (ref 1.7–7.7)
Neutrophils Relative %: 30 %

## 2018-09-21 LAB — CBC
HCT: 38 % — ABNORMAL LOW (ref 39.0–52.0)
Hemoglobin: 13.1 g/dL (ref 13.0–17.0)
MCH: 31.8 pg (ref 26.0–34.0)
MCHC: 34.5 g/dL (ref 30.0–36.0)
MCV: 92.2 fL (ref 80.0–100.0)
Platelets: 151 10*3/uL (ref 150–400)
RBC: 4.12 MIL/uL — ABNORMAL LOW (ref 4.22–5.81)
RDW: 12.3 % (ref 11.5–15.5)
WBC: 3.5 10*3/uL — ABNORMAL LOW (ref 4.0–10.5)
nRBC: 0 % (ref 0.0–0.2)

## 2018-09-21 LAB — PROTIME-INR
INR: 1.1 (ref 0.8–1.2)
Prothrombin Time: 13.6 seconds (ref 11.4–15.2)

## 2018-09-21 LAB — HEMOGLOBIN A1C
Hgb A1c MFr Bld: 11.8 % — ABNORMAL HIGH (ref 4.8–5.6)
Mean Plasma Glucose: 291.96 mg/dL

## 2018-09-21 LAB — ETHANOL: Alcohol, Ethyl (B): <10 mg/dL (ref <10)

## 2018-09-21 LAB — APTT: aPTT: 32 seconds (ref 24–36)

## 2018-09-21 MED ORDER — DULOXETINE HCL 30 MG PO CPEP
30.0000 mg | ORAL_CAPSULE | Freq: Every day | ORAL | Status: DC
  Administered 2018-09-21 – 2018-09-22 (×2): 30 mg via ORAL
  Filled 2018-09-21 (×2): qty 1

## 2018-09-21 MED ORDER — ENOXAPARIN SODIUM 40 MG/0.4ML ~~LOC~~ SOLN
40.0000 mg | SUBCUTANEOUS | Status: DC
  Administered 2018-09-21 – 2018-09-22 (×2): 40 mg via SUBCUTANEOUS
  Filled 2018-09-21 (×2): qty 0.4

## 2018-09-21 MED ORDER — LISINOPRIL 10 MG PO TABS
10.0000 mg | ORAL_TABLET | Freq: Every day | ORAL | Status: DC
  Administered 2018-09-21 – 2018-09-22 (×2): 10 mg via ORAL
  Filled 2018-09-21 (×2): qty 1

## 2018-09-21 MED ORDER — VALACYCLOVIR HCL 500 MG PO TABS
500.0000 mg | ORAL_TABLET | Freq: Every day | ORAL | Status: DC
  Administered 2018-09-21 – 2018-09-22 (×2): 500 mg via ORAL
  Filled 2018-09-21 (×2): qty 1

## 2018-09-21 MED ORDER — ONDANSETRON HCL 4 MG/2ML IJ SOLN: 4.0000 mg | Freq: Four times a day (QID) | INTRAMUSCULAR | Status: DC | PRN

## 2018-09-21 MED ORDER — ONDANSETRON HCL 4 MG PO TABS: 4.0000 mg | ORAL_TABLET | Freq: Four times a day (QID) | ORAL | Status: DC | PRN

## 2018-09-21 MED ORDER — ASPIRIN 81 MG PO CHEW
81.0000 mg | CHEWABLE_TABLET | Freq: Every day | ORAL | Status: DC
  Administered 2018-09-21 – 2018-09-22 (×2): 81 mg via ORAL
  Filled 2018-09-21 (×2): qty 1

## 2018-09-21 MED ORDER — INSULIN GLARGINE 100 UNIT/ML ~~LOC~~ SOLN
30.0000 [IU] | Freq: Every day | SUBCUTANEOUS | Status: DC
  Administered 2018-09-21: 30 [IU] via SUBCUTANEOUS
  Filled 2018-09-21 (×2): qty 0.3

## 2018-09-21 MED ORDER — ATORVASTATIN CALCIUM 20 MG PO TABS
40.0000 mg | ORAL_TABLET | Freq: Every day | ORAL | Status: DC
  Administered 2018-09-21: 17:00:00 40 mg via ORAL
  Filled 2018-09-21: qty 2

## 2018-09-21 MED ORDER — LEVOTHYROXINE SODIUM 50 MCG PO TABS
75.0000 ug | ORAL_TABLET | Freq: Every day | ORAL | Status: DC
  Administered 2018-09-22: 75 ug via ORAL
  Filled 2018-09-21: qty 1

## 2018-09-21 MED ORDER — INSULIN ASPART 100 UNIT/ML ~~LOC~~ SOLN
0.0000 [IU] | Freq: Every day | SUBCUTANEOUS | Status: DC
  Administered 2018-09-21: 21:00:00 2 [IU] via SUBCUTANEOUS
  Filled 2018-09-21: qty 1

## 2018-09-21 MED ORDER — TRAZODONE HCL 50 MG PO TABS
200.0000 mg | ORAL_TABLET | Freq: Every evening | ORAL | Status: DC
  Administered 2018-09-21: 21:00:00 200 mg via ORAL
  Filled 2018-09-21: qty 4

## 2018-09-21 MED ORDER — BICTEGRAVIR-EMTRICITAB-TENOFOV 50-200-25 MG PO TABS
1.0000 | ORAL_TABLET | Freq: Every day | ORAL | Status: DC
  Administered 2018-09-21 – 2018-09-22 (×2): 1 via ORAL
  Filled 2018-09-21 (×2): qty 1

## 2018-09-21 MED ORDER — ASPIRIN 81 MG PO CHEW
324.0000 mg | CHEWABLE_TABLET | Freq: Once | ORAL | Status: AC
  Administered 2018-09-21: 324 mg via ORAL
  Filled 2018-09-21: qty 4

## 2018-09-21 MED ORDER — CLOPIDOGREL BISULFATE 75 MG PO TABS
75.0000 mg | ORAL_TABLET | Freq: Every day | ORAL | Status: DC
  Administered 2018-09-21 – 2018-09-22 (×2): 75 mg via ORAL
  Filled 2018-09-21 (×2): qty 1

## 2018-09-21 MED ORDER — INSULIN ASPART 100 UNIT/ML ~~LOC~~ SOLN
0.0000 [IU] | Freq: Three times a day (TID) | SUBCUTANEOUS | Status: DC
  Administered 2018-09-21 (×2): 15 [IU] via SUBCUTANEOUS
  Administered 2018-09-22: 5 [IU] via SUBCUTANEOUS
  Administered 2018-09-22: 8 [IU] via SUBCUTANEOUS
  Filled 2018-09-21 (×4): qty 1

## 2018-09-21 MED ORDER — ACETAMINOPHEN 650 MG RE SUPP: 650.0000 mg | Freq: Four times a day (QID) | RECTAL | Status: DC | PRN

## 2018-09-21 MED ORDER — ACETAMINOPHEN 325 MG PO TABS: 650.0000 mg | ORAL_TABLET | Freq: Four times a day (QID) | ORAL | Status: DC | PRN

## 2018-09-21 MED ORDER — CITALOPRAM HYDROBROMIDE 20 MG PO TABS
20.0000 mg | ORAL_TABLET | Freq: Every day | ORAL | Status: DC
  Administered 2018-09-21 – 2018-09-22 (×2): 20 mg via ORAL
  Filled 2018-09-21 (×2): qty 1

## 2018-09-21 MED ORDER — STROKE: EARLY STAGES OF RECOVERY BOOK
Freq: Once | Status: AC
  Administered 2018-09-21: 21:00:00 1

## 2018-09-21 NOTE — Evaluation (Signed)
Physical Therapy Evaluation Patient Details Name: Jeffrey Pearson MRN: [id number] DOB: 10-Feb-1961 Today's Date: 09/21/2018   History of Present Illness  58 y/o male here with onset of L sided weakness and vertigo this AM.  He did not have any falls but struggled to use L hand and did drop a container of chocolate milk.  Pt had been driving from [place] on the weekends to take care of parents but since Covid restrictions have started he is staying with his parents and working remotely.  At time of PT exam symptoms had resolved and he reports (and appears) to have no residual limitations and feels that he is at his baseline.  MRI does reveal small infarct.  Clinical Impression  Pt did very well with all requested acts and reports and displays not residual issues. He had equal strength and coordination in b/l U&LEs, showed ease with ambulation and stair negotiation and overall had no issues that would require further PT work-up or care once discharged.  Pt in agreement and is eager to go home when he is cleared by medicine.    Follow Up Recommendations No PT follow up    Equipment Recommendations  None recommended by PT    Recommendations for Other Services       Precautions / Restrictions Precautions Precautions: None Restrictions Weight Bearing Restrictions: No      Mobility  Bed Mobility Overal bed mobility: Independent             General bed mobility comments: easily swings around to EOB  Transfers Overall transfer level: Independent Equipment used: None             General transfer comment: Pt with no hesitation with getting to standing, good confidence and safety  Ambulation/Gait Ambulation/Gait assistance: Independent Gait Distance (Feet): 250 Feet Assistive device: None       General Gait Details: Pt with good speed and confidence with prolonged bout of ambulation. No LOBs, stagger steps, or otherwise concerning issues.   Stairs  Pt able to negotiate  up/down 6 steps w/o UEs using reciprocal pattern.          Wheelchair Mobility    Modified Rankin (Stroke Patients Only)       Balance Overall balance assessment: Independent                                           Pertinent Vitals/Pain Pain Assessment: No/denies pain    Home Living Family/patient expects to be discharged to:: Private residence Living Arrangements: Parent Available Help at Discharge: (Pt is caregiver for mother (dementia), father (prostate CA)) Type of Home: House Home Access: Stairs to enter Entrance Stairs-Rails: None Entrance Stairs-Number of Steps: 5   Home Equipment: None      Prior Function Level of Independence: Independent         Comments: prior to social distancing he would go to gym multiple times a week, works, drives, no issues     Higher education careers adviserHand Dominance        Extremity/Trunk Assessment   Upper Extremity Assessment Upper Extremity Assessment: Overall WFL for tasks assessed(equal bilaterally)    Lower Extremity Assessment Lower Extremity Assessment: Overall WFL for tasks assessed(equal bilaterally)       Communication   Communication: No difficulties  Cognition Arousal/Alertness: Awake/alert Behavior During Therapy: WFL for tasks assessed/performed Overall Cognitive Status: Within Functional Limits for tasks assessed  General Comments      Exercises     Assessment/Plan    PT Assessment Patent does not need any further PT services  PT Problem List Decreased safety awareness;Decreased coordination       PT Treatment Interventions      PT Goals (Current goals can be found in the Care Plan section)  Acute Rehab PT Goals Patient Stated Goal: go home PT Goal Formulation: All assessment and education complete, DC therapy    Frequency     Barriers to discharge        Co-evaluation               AM-PAC PT "6 Clicks" Mobility   Outcome Measure Help needed turning from your back to your side while in a flat bed without using bedrails?: None Help needed moving from lying on your back to sitting on the side of a flat bed without using bedrails?: None Help needed moving to and from a bed to a chair (including a wheelchair)?: None Help needed standing up from a chair using your arms (e.g., wheelchair or bedside chair)?: None Help needed to walk in hospital room?: None Help needed climbing 3-5 steps with a railing? : None 6 Click Score: 24    End of Session Equipment Utilized During Treatment: Gait belt Activity Tolerance: Patient tolerated treatment well Patient left: with call bell/phone within reach;in chair Nurse Communication: Mobility status PT Visit Diagnosis: Muscle weakness (generalized) (M62.81)    Time: 6811-5726 PT Time Calculation (min) (ACUTE ONLY): 18 min   Charges:   PT Evaluation $PT Eval Low Complexity: 1 Low          Malachi Pro, DPT 09/21/2018, 4:27 PM

## 2018-09-21 NOTE — ED Notes (Signed)
Pt to the er for stroke like symptoms. Couldn't coordinate left leg. Left side. Idd. 0500am when he woke up. Went to bed at midnight. Slurred speech

## 2018-09-21 NOTE — TOC Initial Note (Signed)
Transition of Care [facility]) - Initial/Assessment Note    Patient Details  Name: Jeffrey Pearson MRN: [id number] Date of Birth: June 16, 1960  [facility] CM/SW Contact:    Eber Hong, RN Phone Number: 09/21/2018, 10:01 AM  Clinical Narrative:               Placed in observation for symptoms concerning for TIA/stroke Patient resides in [place] but currently is "working from home" at his parents residence in [place] during the pandemic.  PCP is at [facility]. Awoke with weakness in his left extremities.  Well versed of the need to seek immediate medical attention for his sx. IDDM. Independent in all adls, denies issues accessing medical care, obtaining medications or with transportation.    No discharge needs identified at present by care manager or members of care team. MRI in progress     Barriers to Discharge: No Barriers Identified   Patient Goals and CMS Choice Patient states their goals for this hospitalization and ongoing recovery are:: Go home and not have had a stroke CMS Medicare.gov Compare Post Acute Care list provided to:: (NA at present) Choice offered to / list presented to : NA  Expected Discharge Plan and Services     Discharge Planning Services: CM Consult Post Acute Care Choice: NA Living arrangements for the past 2 months: Single Family Home                 DME Arranged: N/A DME Agency: NA       HH Arranged: NA HH Agency: NA        Prior Living Arrangements/Services Living arrangements for the past 2 months: Single Family Home Lives with:: Self Patient language and need for interpreter reviewed:: No Do you feel safe going back to the place where you live?: Yes      Need for Family Participation in Patient Care: No (Comment) Care giver support system in place?: Yes (comment) Current home services: (None) Criminal Activity/Legal Involvement Pertinent to Current Situation/Hospitalization: No - Comment as needed  Activities  of Daily Living      Permission Sought/Granted Permission sought to share information with : Case Manager Permission granted to share information with : Yes, Verbal Permission Granted              Emotional Assessment Appearance:: Appears younger than stated age, Well-Groomed Attitude/Demeanor/Rapport: Gracious Affect (typically observed): Calm, Accepting, Appropriate Orientation: : Oriented to Self, Oriented to Place, Oriented to  Time, Oriented to Situation Alcohol / Substance Use: Not Applicable Psych Involvement: No (comment)  Admission diagnosis:  Slurred speech, coordination problems Patient Active Problem List   Diagnosis Date Noted  . TIA (transient ischemic attack) 09/21/2018  . Acute encephalopathy   . Hyperglycemic hyperosmolar nonketotic coma (HCC) 08/23/2018   PCP:  Thana Ates, MD Pharmacy:   [facility] [address]), [place] - [address] [place]) [place] [address] Phone: [phone] Fax: [phone]     Social Determinants of Health (SDOH) Interventions    Readmission Risk Interventions No flowsheet data found.

## 2018-09-21 NOTE — ED Notes (Signed)
Echo tech on way to complete order at bedside.

## 2018-09-21 NOTE — ED Notes (Signed)
ED TO INPATIENT HANDOFF REPORT  ED Nurse Name and Phone #:   S Name/Age/Gender Jeffrey Pearson 58 y.o. male Room/Bed: ED07A/ED07A  Code Status   Code Status: Full Code  Home/SNF/Other Home Patient oriented to: self, place, time and situation Is this baseline? Yes   Triage Complete: Triage complete  Chief Complaint Slurred speech, coordination problems  Triage Note Patient ambulatory to triage with steady gait, without difficulty or distress noted; pt reports slurred speech and left sided weakness upon awakening this morning; denies hx of same; denies pain; pt taken to room 7 by EDT Lydia to be placed on card monitor for EKG and further eval; report called to care nurse Izzy, RN   Allergies Allergies  Allergen Reactions  . Septra [Sulfamethoxazole-Trimethoprim] Rash    Level of Care/Admitting Diagnosis ED Disposition    ED Disposition Condition Comment   Admit  Hospital Area: [facility] [[id number]]  Level of Care: Med-Surg [16]  Covid Evaluation: N/A  Diagnosis: TIA (transient ischemic attack) [161096]  Admitting Physician: Enid Baas [[id number]]  Attending Physician: Enid Baas [[id number]]  Bed request comments: 1c  PT Class (Do Not Modify): Observation [104]  PT Acc Code (Do Not Modify): Observation [10022]       B Medical/Surgery History Past Medical History:  Diagnosis Date  . Depression   . Diabetes mellitus without complication (HCC)   . HIV (human immunodeficiency virus infection) (HCC)   . Hypertension   . Hyperthyroidism   . Thyroid disease    Past Surgical History:  Procedure Laterality Date  . lymph nodes  1982  . THYROIDECTOMY    . THYROIDECTOMY, PARTIAL  1980  . TONSILLECTOMY       A IV Location/Drains/Wounds Patient Lines/Drains/Airways Status   Active Line/Drains/Airways    Name:   Placement date:   Placement time:   Site:   Days:   Peripheral IV 09/21/18 Right Forearm   09/21/18    0619    Forearm   less  than 1          Intake/Output Last 24 hours No intake or output data in the 24 hours ending 09/21/18 1407  Labs/Imaging Results for orders placed or performed during the hospital encounter of 09/21/18 (from the past 48 hour(s))  Protime-INR     Status: None   Collection Time: 09/21/18  6:11 AM  Result Value Ref Range   Prothrombin Time 13.6 11.4 - 15.2 seconds   INR 1.1 0.8 - 1.2    Comment: (NOTE) INR goal varies based on device and disease states. Performed at [facility], [address] [place], [place] [address]   APTT     Status: None   Collection Time: 09/21/18  6:11 AM  Result Value Ref Range   aPTT 32 24 - 36 seconds    Comment: Performed at [facility], [address] [place], [place] [address]  CBC     Status: Abnormal   Collection Time: 09/21/18  6:11 AM  Result Value Ref Range   WBC 3.5 (L) 4.0 - 10.5 K/uL   RBC 4.12 (L) 4.22 - 5.81 MIL/uL   Hemoglobin 13.1 13.0 - 17.0 g/dL   HCT 86.5 (L) 78.4 - 69.6 %   MCV 92.2 80.0 - 100.0 fL   MCH 31.8 26.0 - 34.0 pg   MCHC 34.5 30.0 - 36.0 g/dL   RDW 29.5 28.4 - 13.2 %   Platelets 151 150 - 400 K/uL   nRBC 0.0 0.0 -  0.2 %    Comment: Performed at [facility], [address] [place], [place] [address]  Differential     Status: Abnormal   Collection Time: 09/21/18  6:11 AM  Result Value Ref Range   Neutrophils Relative % 30 %   Neutro Abs 1.1 (L) 1.7 - 7.7 K/uL   Lymphocytes Relative 52 %   Lymphs Abs 1.8 0.7 - 4.0 K/uL   Monocytes Relative 7 %   Monocytes Absolute 0.3 0.1 - 1.0 K/uL   Eosinophils Relative 9 %   Eosinophils Absolute 0.3 0.0 - 0.5 K/uL   Basophils Relative 1 %   Basophils Absolute 0.1 0.0 - 0.1 K/uL   Immature Granulocytes 1 %   Abs Immature Granulocytes 0.02 0.00 - 0.07 K/uL    Comment: Performed at [facility], [address] [place], [place] [address]  Comprehensive metabolic panel     Status: Abnormal   Collection Time: 09/21/18  6:11 AM   Result Value Ref Range   Sodium 128 (L) 135 - 145 mmol/L   Potassium 4.2 3.5 - 5.1 mmol/L   Chloride 95 (L) 98 - 111 mmol/L   CO2 24 22 - 32 mmol/L   Glucose, Bld 264 (H) 70 - 99 mg/dL   BUN 16 6 - 20 mg/dL   Creatinine, Ser 0.98 0.61 - 1.24 mg/dL   Calcium 8.0 (L) 8.9 - 10.3 mg/dL   Total Protein 6.3 (L) 6.5 - 8.1 g/dL   Albumin 3.2 (L) 3.5 - 5.0 g/dL   AST 33 15 - 41 U/L   ALT 31 0 - 44 U/L   Alkaline Phosphatase 114 38 - 126 U/L   Total Bilirubin 0.6 0.3 - 1.2 mg/dL   GFR calc non Af Amer >60 >60 mL/min   GFR calc Af Amer >60 >60 mL/min   Anion gap 9 5 - 15    Comment: Performed at [facility], [address], [place], [place] [address]  SARS Coronavirus 2 (CEPHEID - Performed in [facility] hospital lab), Hosp Order     Status: None   Collection Time: 09/21/18  6:11 AM  Result Value Ref Range   SARS Coronavirus 2 NEGATIVE NEGATIVE    Comment: (NOTE) If result is NEGATIVE SARS-CoV-2 target nucleic acids are NOT DETECTED. The SARS-CoV-2 RNA is generally detectable in upper and lower  respiratory specimens during the acute phase of infection. The lowest  concentration of SARS-CoV-2 viral copies this assay can detect is 250  copies / mL. A negative result does not preclude SARS-CoV-2 infection  and should not be used as the sole basis for treatment or other  patient management decisions.  A negative result may occur with  improper specimen collection / handling, submission of specimen other  than nasopharyngeal swab, presence of viral mutation(s) within the  areas targeted by this assay, and inadequate number of viral copies  (<250 copies / mL). A negative result must be combined with clinical  observations, patient history, and epidemiological information. If result is POSITIVE SARS-CoV-2 target nucleic acids are DETECTED. The SARS-CoV-2 RNA is generally detectable in upper and lower  respiratory specimens dur ing the acute phase of infection.  Positive   results are indicative of active infection with SARS-CoV-2.  Clinical  correlation with patient history and other diagnostic information is  necessary to determine patient infection status.  Positive results do  not rule out bacterial infection or co-infection with other viruses. If result is PRESUMPTIVE POSTIVE SARS-CoV-2 nucleic acids  MAY BE PRESENT.   A presumptive positive result was obtained on the submitted specimen  and confirmed on repeat testing.  While 2019 novel coronavirus  (SARS-CoV-2) nucleic acids may be present in the submitted sample  additional confirmatory testing may be necessary for epidemiological  and / or clinical management purposes  to differentiate between  SARS-CoV-2 and other Sarbecovirus currently known to infect humans.  If clinically indicated additional testing with an alternate test  methodology [phone]) is advised. The SARS-CoV-2 RNA is generally  detectable in upper and lower respiratory sp ecimens during the acute  phase of infection. The expected result is Negative. Fact Sheet for Patients:  BoilerBrush.com.cy Fact Sheet for Healthcare Providers: https://pope.com/ This test is not yet approved or cleared by the [place] FDA and has been authorized for detection and/or diagnosis of SARS-CoV-2 by FDA under an Emergency Use Authorization (EUA).  This EUA will remain in effect (meaning this test can be used) for the duration of the COVID-19 declaration under Section 564(b)(1) of the Act, 21 U.S.C. section 360bbb-3(b)(1), unless the authorization is terminated or revoked sooner. Performed at [facility], [address] [place], [place] [address]   Hemoglobin A1c     Status: Abnormal   Collection Time: 09/21/18  6:11 AM  Result Value Ref Range   Hgb A1c MFr Bld 11.8 (H) 4.8 - 5.6 %    Comment: (NOTE) Pre diabetes:          5.7%-6.4% Diabetes:              >6.4% Glycemic control for    <7.0% adults with diabetes    Mean Plasma Glucose 291.96 mg/dL    Comment: Performed at [facility], [address] [place], [place] [address]  Lipid panel     Status: Abnormal   Collection Time: 09/21/18  6:11 AM  Result Value Ref Range   Cholesterol 191 0 - 200 mg/dL   Triglycerides 038 (H) <150 mg/dL   HDL 69 >33 mg/dL   Total CHOL/HDL Ratio 2.8 RATIO   VLDL 40 0 - 40 mg/dL   LDL Cholesterol 82 0 - 99 mg/dL    Comment:        Total Cholesterol/HDL:CHD Risk Coronary Heart Disease Risk Table                     Men   Women  1/2 Average Risk   3.4   3.3  Average Risk       5.0   4.4  2 X Average Risk   9.6   7.1  3 X Average Risk  23.4   11.0        Use the calculated Patient Ratio above and the CHD Risk Table to determine the patient's CHD Risk.        ATP III CLASSIFICATION (LDL):  <100     mg/dL   Optimal  383-291  mg/dL   Near or Above                    Optimal  130-159  mg/dL   Borderline  916-606  mg/dL   High  >004     mg/dL   Very High Performed at [facility], [address] [place], [place] [address]   Glucose, capillary     Status: Abnormal   Collection Time: 09/21/18  6:16 AM  Result Value Ref Range   Glucose-Capillary 286 (H) 70 - 99 mg/dL  Urine Drug Screen, Qualitative     Status: None   Collection Time: 09/21/18  6:41 AM  Result Value Ref Range   Tricyclic, Ur Screen NONE DETECTED NONE DETECTED   Amphetamines, Ur Screen NONE DETECTED NONE DETECTED   MDMA (Ecstasy)Ur Screen NONE DETECTED NONE DETECTED   Cocaine Metabolite,Ur [place] NONE DETECTED NONE DETECTED   Opiate, Ur Screen NONE DETECTED NONE DETECTED   Phencyclidine (PCP) Ur S NONE DETECTED NONE DETECTED   Cannabinoid 50 Ng, Ur [place] NONE DETECTED NONE DETECTED   Barbiturates, Ur Screen NONE DETECTED NONE DETECTED   Benzodiazepine, Ur Scrn NONE DETECTED NONE DETECTED   Methadone Scn, Ur NONE DETECTED NONE DETECTED    Comment: (NOTE) Tricyclics + metabolites, urine    Cutoff  1000 ng/mL Amphetamines + metabolites, urine  Cutoff 1000 ng/mL MDMA (Ecstasy), urine              Cutoff 500 ng/mL Cocaine Metabolite, urine          Cutoff 300 ng/mL Opiate + metabolites, urine        Cutoff 300 ng/mL Phencyclidine (PCP), urine         Cutoff 25 ng/mL Cannabinoid, urine                 Cutoff 50 ng/mL Barbiturates + metabolites, urine  Cutoff 200 ng/mL Benzodiazepine, urine              Cutoff 200 ng/mL Methadone, urine                   Cutoff 300 ng/mL The urine drug screen provides only a preliminary, unconfirmed analytical test result and should not be used for non-medical purposes. Clinical consideration and professional judgment should be applied to any positive drug screen result due to possible interfering substances. A more specific alternate chemical method must be used in order to obtain a confirmed analytical result. Gas chromatography / mass spectrometry (GC/MS) is the preferred confirmat ory method. Performed at [facility], [address] [place], [place] [address]   Urinalysis, Routine w reflex microscopic     Status: Abnormal   Collection Time: 09/21/18  6:41 AM  Result Value Ref Range   Color, Urine YELLOW (A) YELLOW   APPearance CLEAR (A) CLEAR   Specific Gravity, Urine 1.021 1.005 - 1.030   pH 6.0 5.0 - 8.0   Glucose, UA >=500 (A) NEGATIVE mg/dL   Hgb urine dipstick NEGATIVE NEGATIVE   Bilirubin Urine NEGATIVE NEGATIVE   Ketones, ur NEGATIVE NEGATIVE mg/dL   Protein, ur NEGATIVE NEGATIVE mg/dL   Nitrite NEGATIVE NEGATIVE   Leukocytes,Ua NEGATIVE NEGATIVE   WBC, UA 0-5 0 - 5 WBC/hpf   Bacteria, UA NONE SEEN NONE SEEN   Squamous Epithelial / LPF 0-5 0 - 5   Mucus PRESENT    Hyaline Casts, UA PRESENT     Comment: Performed at [facility], [address] [place], [place] [address]  Ethanol     Status: None   Collection Time: 09/21/18  6:41 AM  Result Value Ref Range   Alcohol, Ethyl (B) <10 <10 mg/dL    Comment:  (NOTE) Lowest detectable limit for serum alcohol is 10 mg/dL. For medical purposes only. Performed at [facility], [address] [place], [place] [address]   Glucose, capillary     Status: Abnormal   Collection Time: 09/21/18  8:48 AM  Result Value Ref Range   Glucose-Capillary 398 (H) 70 - 99 mg/dL  Comment 1 Notify RN    Comment 2 Document in Chart   Glucose, capillary     Status: Abnormal   Collection Time: 09/21/18  1:27 PM  Result Value Ref Range   Glucose-Capillary 266 (H) 70 - 99 mg/dL   Ct Head Wo Contrast  Result Date: 09/21/2018 CLINICAL DATA:  Left upper and lower extremity weakness and difficulty streaky which is now resolved EXAM: CT HEAD WITHOUT CONTRAST TECHNIQUE: Contiguous axial images were obtained from the base of the skull through the vertex without intravenous contrast. COMPARISON:  08/23/2018 FINDINGS: Brain: No evidence of acute infarction, hemorrhage, hydrocephalus, extra-axial collection or mass lesion/mass effect. Low-density in the cerebral white matter attributed to chronic small vessel ischemia given vascular risk factors. Normal brain volume. Vascular: Atherosclerotic calcification.  No hyperdense vessel. Skull: Negative Sinuses/Orbits: Negative IMPRESSION: 1. No acute finding. 2. Chronic small vessel ischemia. Electronically Signed   By: Marnee SpringJonathon  Watts M.D.   On: 09/21/2018 06:39   Mr Maxine GlennMra Head Wo Contrast  Result Date: 09/21/2018 CLINICAL DATA:  Transient left-sided weakness and slurred speech today. EXAM: MRI HEAD WITHOUT CONTRAST MRA HEAD WITHOUT CONTRAST TECHNIQUE: Multiplanar, multiecho pulse sequences of the brain and surrounding structures were obtained without intravenous contrast. Angiographic images of the head were obtained using MRA technique without contrast. COMPARISON:  Head CT 09/21/2018 FINDINGS: MRI HEAD FINDINGS Brain: There is a thin, curvilinear focus of restricted diffusion measuring 1-1.5 cm in length in the region of the  posterior caudate body on the right consistent with an acute infarct. Patchy T2 hyperintensities in the cerebral white matter, thalami, and pons are nonspecific but compatible with moderate chronic small vessel ischemic disease. A small chronic infarct is noted in the cerebellum on the left. No intracranial hemorrhage, mass, midline shift, or extra-axial fluid collection is identified. There is mild cerebral atrophy. Vascular: Major intracranial arterial flow voids are preserved. Abnormal signal in the non-dominant left transverse and sigmoid sinuses and upper left internal jugular vein is favored to reflect slow flow rather than occlusion. Skull and upper cervical spine: Nonspecific calvarial bone marrow heterogeneity without a destructive lesion identified. Sinuses/Orbits: Unremarkable orbits. Small left maxillary sinus mucous retention cyst. Clear mastoid air cells. Other: None. MRA HEAD FINDINGS The visualized distal vertebral arteries are widely patent to the basilar and codominant. Patent PICA, AICA, and SCA origins are identified bilaterally. The basilar artery is widely patent. There are small right and possibly small left posterior communicating arteries. PCAs are patent without evidence of significant proximal stenosis. The internal carotid arteries are widely patent from skull base to carotid termini. ACAs and MCAs are patent without evidence of proximal branch occlusion or flow limiting proximal stenosis. The appearance of mild bilateral M1 and right ACA origin stenoses is favored to be secondary to mild motion artifact. No aneurysm is identified. IMPRESSION: 1. 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 2. Moderate chronic small vessel ischemic disease. 3. Patent anterior and posterior intracranial arterial circulation without evidence of major branch occlusion or significant proximal stenosis. Electronically Signed   By: Sebastian AcheAllen  Grady M.D.   On: 09/21/2018 11:12   Mr Brain Wo Contrast  Result  Date: 09/21/2018 CLINICAL DATA:  Transient left-sided weakness and slurred speech today. EXAM: MRI HEAD WITHOUT CONTRAST MRA HEAD WITHOUT CONTRAST TECHNIQUE: Multiplanar, multiecho pulse sequences of the brain and surrounding structures were obtained without intravenous contrast. Angiographic images of the head were obtained using MRA technique without contrast. COMPARISON:  Head CT 09/21/2018 FINDINGS: MRI HEAD FINDINGS Brain:  There is a thin, curvilinear focus of restricted diffusion measuring 1-1.5 cm in length in the region of the posterior caudate body on the right consistent with an acute infarct. Patchy T2 hyperintensities in the cerebral white matter, thalami, and pons are nonspecific but compatible with moderate chronic small vessel ischemic disease. A small chronic infarct is noted in the cerebellum on the left. No intracranial hemorrhage, mass, midline shift, or extra-axial fluid collection is identified. There is mild cerebral atrophy. Vascular: Major intracranial arterial flow voids are preserved. Abnormal signal in the non-dominant left transverse and sigmoid sinuses and upper left internal jugular vein is favored to reflect slow flow rather than occlusion. Skull and upper cervical spine: Nonspecific calvarial bone marrow heterogeneity without a destructive lesion identified. Sinuses/Orbits: Unremarkable orbits. Small left maxillary sinus mucous retention cyst. Clear mastoid air cells. Other: None. MRA HEAD FINDINGS The visualized distal vertebral arteries are widely patent to the basilar and codominant. Patent PICA, AICA, and SCA origins are identified bilaterally. The basilar artery is widely patent. There are small right and possibly small left posterior communicating arteries. PCAs are patent without evidence of significant proximal stenosis. The internal carotid arteries are widely patent from skull base to carotid termini. ACAs and MCAs are patent without evidence of proximal branch occlusion or  flow limiting proximal stenosis. The appearance of mild bilateral M1 and right ACA origin stenoses is favored to be secondary to mild motion artifact. No aneurysm is identified. IMPRESSION: 1. 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 2. Moderate chronic small vessel ischemic disease. 3. Patent anterior and posterior intracranial arterial circulation without evidence of major branch occlusion or significant proximal stenosis. Electronically Signed   By: Sebastian Ache M.D.   On: 09/21/2018 11:12   US Carotid Bilateral  Result Date: 09/21/2018 CLINICAL DATA:  TIA symptoms, small acute right caudate nucleus infarct EXAM: BILATERAL CAROTID DUPLEX ULTRASOUND TECHNIQUE: Wallace Cullens scale imaging, color Doppler and duplex ultrasound were performed of bilateral carotid and vertebral arteries in the neck. COMPARISON:  09/21/2018 MR FINDINGS: Criteria: Quantification of carotid stenosis is based on velocity parameters that correlate the residual internal carotid diameter with NASCET-based stenosis levels, using the diameter of the distal internal carotid lumen as the denominator for stenosis measurement. The following velocity measurements were obtained: RIGHT ICA: 105/36 cm/sec CCA: 87/14 cm/sec SYSTOLIC ICA/CCA RATIO:  1.2 ECA: 102 cm/sec LEFT ICA: 69/25 cm/sec CCA: 91/16 cm/sec SYSTOLIC ICA/CCA RATIO:  0.8 ECA: 88 cm/sec RIGHT CAROTID ARTERY: Minor echogenic shadowing plaque formation. No hemodynamically significant right ICA stenosis, velocity elevation, or turbulent flow. Degree of narrowing less than 50%. RIGHT VERTEBRAL ARTERY:  Antegrade LEFT CAROTID ARTERY: Similar scattered minor echogenic plaque formation. No hemodynamically significant left ICA stenosis, velocity elevation, or turbulent flow. LEFT VERTEBRAL ARTERY:  Antegrade IMPRESSION: Minor carotid atherosclerosis. No hemodynamically significant ICA stenosis. Degree of narrowing less than 50% bilaterally by ultrasound criteria. Patent antegrade vertebral  flow bilaterally Electronically Signed   By: Judie Petit.  Shick M.D.   On: 09/21/2018 13:42    Pending Labs Unresulted Labs (From admission, onward)    Start     Ordered   09/22/18 0500  Basic metabolic panel  Tomorrow morning,   STAT     09/21/18 0914   09/22/18 0500  CBC  Tomorrow morning,   STAT     09/21/18 0914          Vitals/Pain Today's Vitals   09/21/18 0800 09/21/18 1126 09/21/18 1334 09/21/18 1406  BP: 111/66 116/68 124/76 118/68  Pulse: 70 74 66 74  Resp: 16 17 18 17   Temp:      TempSrc:      SpO2: 96% 98% 99% 100%  Weight:      Height:      PainSc:  0-No pain 0-No pain 0-No pain    Isolation Precautions No active isolations  Medications Medications  bictegravir-emtricitabine-tenofovir AF (BIKTARVY) 50-200-25 MG per tablet 1 tablet (1 tablet Oral Given 09/21/18 1119)  valACYclovir (VALTREX) tablet 500 mg (500 mg Oral Given 09/21/18 1119)  lisinopril (ZESTRIL) tablet 10 mg (10 mg Oral Given 09/21/18 1120)  citalopram (CELEXA) tablet 20 mg (20 mg Oral Given 09/21/18 1120)  DULoxetine (CYMBALTA) DR capsule 30 mg (30 mg Oral Given 09/21/18 1120)  traZODone (DESYREL) tablet 200 mg (has no administration in time range)  insulin glargine (LANTUS) injection 30 Units (30 Units Subcutaneous Given 09/21/18 1124)  levothyroxine (SYNTHROID) tablet 75 mcg (has no administration in time range)  enoxaparin (LOVENOX) injection 40 mg (40 mg Subcutaneous Given 09/21/18 1118)  acetaminophen (TYLENOL) tablet 650 mg (has no administration in time range)    Or  acetaminophen (TYLENOL) suppository 650 mg (has no administration in time range)  ondansetron (ZOFRAN) tablet 4 mg (has no administration in time range)    Or  ondansetron (ZOFRAN) injection 4 mg (has no administration in time range)  aspirin chewable tablet 81 mg (81 mg Oral Given 09/21/18 1119)  atorvastatin (LIPITOR) tablet 40 mg (has no administration in time range)  insulin aspart (novoLOG) injection 0-15 Units (15 Units  Subcutaneous Given 09/21/18 0900)  insulin aspart (novoLOG) injection 0-5 Units (has no administration in time range)  aspirin chewable tablet 324 mg (324 mg Oral Given 09/21/18 0753)    Mobility walks Low fall risk   Focused Assessments nuero    R Recommendations: See Admitting Provider Note  Report given to:   Additional Notes:

## 2018-09-21 NOTE — ED Triage Notes (Addendum)
Patient ambulatory to triage with steady gait, without difficulty or distress noted; pt reports slurred speech and left sided weakness upon awakening this morning; denies hx of same; denies pain; pt taken to room 7 by EDT Isabelle Course to be placed on card monitor for EKG and further eval; report called to care nurse Maggie Schwalbe, RN

## 2018-09-21 NOTE — H&P (Signed)
[facility] [place] at [facility]   PATIENT NAME: Jeffrey Pearson    MR#:  [id number]  DATE OF BIRTH:  07/15/1960  DATE OF ADMISSION:  09/21/2018  PRIMARY CARE PHYSICIAN: Thana Ates, MD   REQUESTING/REFERRING PHYSICIAN: Dr. Ileana Roup  CHIEF COMPLAINT:   Chief Complaint  Patient presents with   Weakness    HISTORY OF PRESENT ILLNESS:  Jeffrey Pearson  is a 58 y.o. male with a known history of HIV on HAART, hypertension, hypothyroidism status post subtotal thyroidectomy, depression, uncontrolled diabetes mellitus presents to hospital secondary to left-sided weakness and discoordinated movements on the left side this morning. Patient is from [place], currently due to social isolation, he has been staying with his parents and working from home.  He states he was fine when he went to bed last night.  At 530 he woke up as his usual time, felt disoriented felt like he was swelling more towards his right side.  He just felt funny on his left side.  He is right-handed.  When he opened his fridge to take milk he dropped it with his left hand.  His speech was slightly slurred.  No previous history of strokes.  No fevers or chills.  Intermittent headaches in the last couple of days.  He presented to the emergency room.  He was being admitted for possible TIA.  His symptoms have completely resolved within 1 hour of onset.  PAST MEDICAL HISTORY:   Past Medical History:  Diagnosis Date   Depression    Diabetes mellitus without complication (HCC)    HIV (human immunodeficiency virus infection) (HCC)    Hypertension    Hyperthyroidism    Thyroid disease     PAST SURGICAL HISTORY:   Past Surgical History:  Procedure Laterality Date   lymph nodes  1982   THYROIDECTOMY     THYROIDECTOMY, PARTIAL  1980   TONSILLECTOMY      SOCIAL HISTORY:   Social History   Tobacco Use   Smoking status: Never Smoker   Smokeless tobacco: Never Used  Substance Use  Topics   Alcohol use: No    FAMILY HISTORY:   Family History  Problem Relation Age of Onset   Dementia Mother    Prostate cancer Father     DRUG ALLERGIES:   Allergies  Allergen Reactions   Septra [Sulfamethoxazole-Trimethoprim] Rash    REVIEW OF SYSTEMS:   Review of Systems  Constitutional: Negative for chills, fever, malaise/fatigue and weight loss.  HENT: Negative for ear discharge, ear pain, hearing loss, nosebleeds and tinnitus.   Eyes: Negative for blurred vision, double vision and photophobia.  Respiratory: Negative for cough, hemoptysis, shortness of breath and wheezing.   Cardiovascular: Negative for chest pain, palpitations, orthopnea and leg swelling.  Gastrointestinal: Negative for abdominal pain, constipation, diarrhea, heartburn, melena, nausea and vomiting.  Genitourinary: Negative for dysuria, frequency, hematuria and urgency.  Musculoskeletal: Negative for back pain, myalgias and neck pain.  Skin: Negative for rash.  Neurological: Positive for speech change and focal weakness. Negative for dizziness, tingling, tremors, sensory change and headaches.  Endo/Heme/Allergies: Does not bruise/bleed easily.  Psychiatric/Behavioral: Negative for depression.    MEDICATIONS AT HOME:   Prior to Admission medications   Medication Sig Start Date End Date Taking? Authorizing Provider  atorvastatin (LIPITOR) 10 MG tablet Take 10 mg by mouth daily.   Yes [provider]  bictegravir-emtricitabine-tenofovir AF (BIKTARVY) 50-200-25 MG TABS tablet Take 1 tablet by mouth daily. 07/16/18  Yes  [provider]  citalopram (CELEXA) 20 MG tablet Take 20 mg by mouth daily.  12/08/17 12/08/18 Yes [provider]  DULoxetine (CYMBALTA) 30 MG capsule Take 30 mg by mouth daily.   Yes [provider]  insulin aspart (NOVOLOG FLEXPEN) 100 UNIT/ML FlexPen Inject 0-50 Units into the skin daily. 06/13/18  Yes [provider]  Insulin Degludec  (TRESIBA) 100 UNIT/ML SOLN Inject 30 Units into the skin daily.   Yes [provider]  levothyroxine (SYNTHROID) 75 MCG tablet Take 75 mcg by mouth daily before breakfast.   Yes [provider]  lisinopril (ZESTRIL) 10 MG tablet Take 10 mg by mouth daily.   Yes [provider]  traZODone (DESYREL) 100 MG tablet Take 200 mg by mouth Nightly. 06/04/18  Yes [provider]  valACYclovir (VALTREX) 1000 MG tablet Take 500 mg by mouth daily.    Yes [provider]      VITAL SIGNS:  Blood pressure 129/69, pulse 66, temperature 97.9 F (36.6 C), resp. rate 18, height  (1.753 m), weight 95.3 kg, SpO2 97 %.  PHYSICAL EXAMINATION:  Physical Exam  GENERAL:  57 y.o.-year-old patient lying in the bed with no acute distress.  EYES: Pupils equal, round, reactive to light and accommodation. No scleral icterus. Extraocular muscles intact.  HEENT: Head atraumatic, normocephalic. Oropharynx and nasopharynx clear.  NECK:  Supple, no jugular venous distention. No thyroid enlargement, no tenderness.  LUNGS: Normal breath sounds bilaterally, no wheezing, rales,rhonchi or crepitation. No use of accessory muscles of respiration.  CARDIOVASCULAR: S1, S2 normal. No murmurs, rubs, or gallops.  ABDOMEN: Soft, nontender, nondistended. Bowel sounds present. No organomegaly or mass.  EXTREMITIES: No pedal edema, cyanosis, or clubbing.  NEUROLOGIC: Cranial nerves II through XII are intact. Muscle strength 5/5 in all extremities.  No weakness identified during my exam.  No paresthesias.  Speech is completely normal.  Sensation intact. Gait not checked.  PSYCHIATRIC: The patient is alert and oriented x 3.  SKIN: No obvious rash, lesion, or ulcer.   LABORATORY PANEL:   CBC Recent Labs  Lab 09/21/18 0611  WBC 3.5*  HGB 13.1  HCT 38.0*  PLT 151    ------------------------------------------------------------------------------------------------------------------  Chemistries  Recent Labs  Lab 09/21/18 0611  NA 128*  K 4.2  CL 95*  CO2 24  GLUCOSE 264*  BUN 16  CREATININE 1.13  CALCIUM 8.0*  AST 33  ALT 31  ALKPHOS 114  BILITOT 0.6   ------------------------------------------------------------------------------------------------------------------  Cardiac Enzymes No results for input(s): TROPONINI in the last 168 hours. ------------------------------------------------------------------------------------------------------------------  RADIOLOGY:  Ct Head Wo Contrast  Result Date: 09/21/2018 CLINICAL DATA:  Left upper and lower extremity weakness and difficulty streaky which is now resolved EXAM: CT HEAD WITHOUT CONTRAST TECHNIQUE: Contiguous axial images were obtained from the base of the skull through the vertex without intravenous contrast. COMPARISON:  08/23/2018 FINDINGS: Brain: No evidence of acute infarction, hemorrhage, hydrocephalus, extra-axial collection or mass lesion/mass effect. Low-density in the cerebral white matter attributed to chronic small vessel ischemia given vascular risk factors. Normal brain volume. Vascular: Atherosclerotic calcification.  No hyperdense vessel. Skull: Negative Sinuses/Orbits: Negative IMPRESSION: 1. No acute finding. 2. Chronic small vessel ischemia. Electronically Signed   By: Marnee Spring M.D.   On: 09/21/2018 06:39   Mr Maxine Glenn Head Wo Contrast  Result Date: 09/21/2018 CLINICAL DATA:  Transient left-sided weakness and slurred speech today. EXAM: MRI HEAD WITHOUT CONTRAST MRA HEAD WITHOUT CONTRAST TECHNIQUE: Multiplanar, multiecho pulse sequences of the brain  and surrounding structures were obtained without intravenous contrast. Angiographic images of the head were obtained using MRA technique without contrast. COMPARISON:  Head CT 09/21/2018 FINDINGS: MRI HEAD FINDINGS Brain: There  is a thin, curvilinear focus of restricted diffusion measuring 1-1.5 cm in length in the region of the posterior caudate body on the right consistent with an acute infarct. Patchy T2 hyperintensities in the cerebral white matter, thalami, and pons are nonspecific but compatible with moderate chronic small vessel ischemic disease. A small chronic infarct is noted in the cerebellum on the left. No intracranial hemorrhage, mass, midline shift, or extra-axial fluid collection is identified. There is mild cerebral atrophy. Vascular: Major intracranial arterial flow voids are preserved. Abnormal signal in the non-dominant left transverse and sigmoid sinuses and upper left internal jugular vein is favored to reflect slow flow rather than occlusion. Skull and upper cervical spine: Nonspecific calvarial bone marrow heterogeneity without a destructive lesion identified. Sinuses/Orbits: Unremarkable orbits. Small left maxillary sinus mucous retention cyst. Clear mastoid air cells. Other: None. MRA HEAD FINDINGS The visualized distal vertebral arteries are widely patent to the basilar and codominant. Patent PICA, AICA, and SCA origins are identified bilaterally. The basilar artery is widely patent. There are small right and possibly small left posterior communicating arteries. PCAs are patent without evidence of significant proximal stenosis. The internal carotid arteries are widely patent from skull base to carotid termini. ACAs and MCAs are patent without evidence of proximal branch occlusion or flow limiting proximal stenosis. The appearance of mild bilateral M1 and right ACA origin stenoses is favored to be secondary to mild motion artifact. No aneurysm is identified. IMPRESSION: 1. 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 2. Moderate chronic small vessel ischemic disease. 3. Patent anterior and posterior intracranial arterial circulation without evidence of major branch occlusion or significant proximal stenosis.  Electronically Signed   By: Sebastian Ache M.D.   On: 09/21/2018 11:12   Mr Brain Wo Contrast  Result Date: 09/21/2018 CLINICAL DATA:  Transient left-sided weakness and slurred speech today. EXAM: MRI HEAD WITHOUT CONTRAST MRA HEAD WITHOUT CONTRAST TECHNIQUE: Multiplanar, multiecho pulse sequences of the brain and surrounding structures were obtained without intravenous contrast. Angiographic images of the head were obtained using MRA technique without contrast. COMPARISON:  Head CT 09/21/2018 FINDINGS: MRI HEAD FINDINGS Brain: There is a thin, curvilinear focus of restricted diffusion measuring 1-1.5 cm in length in the region of the posterior caudate body on the right consistent with an acute infarct. Patchy T2 hyperintensities in the cerebral white matter, thalami, and pons are nonspecific but compatible with moderate chronic small vessel ischemic disease. A small chronic infarct is noted in the cerebellum on the left. No intracranial hemorrhage, mass, midline shift, or extra-axial fluid collection is identified. There is mild cerebral atrophy. Vascular: Major intracranial arterial flow voids are preserved. Abnormal signal in the non-dominant left transverse and sigmoid sinuses and upper left internal jugular vein is favored to reflect slow flow rather than occlusion. Skull and upper cervical spine: Nonspecific calvarial bone marrow heterogeneity without a destructive lesion identified. Sinuses/Orbits: Unremarkable orbits. Small left maxillary sinus mucous retention cyst. Clear mastoid air cells. Other: None. MRA HEAD FINDINGS The visualized distal vertebral arteries are widely patent to the basilar and codominant. Patent PICA, AICA, and SCA origins are identified bilaterally. The basilar artery is widely patent. There are small right and possibly small left posterior communicating arteries. PCAs are patent without evidence of significant proximal stenosis. The internal carotid arteries are widely patent  from  skull base to carotid termini. ACAs and MCAs are patent without evidence of proximal branch occlusion or flow limiting proximal stenosis. The appearance of mild bilateral M1 and right ACA origin stenoses is favored to be secondary to mild motion artifact. No aneurysm is identified. IMPRESSION: 1. 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 2. Moderate chronic small vessel ischemic disease. 3. Patent anterior and posterior intracranial arterial circulation without evidence of major branch occlusion or significant proximal stenosis. Electronically Signed   By: Sebastian Ache M.D.   On: 09/21/2018 11:12   US Carotid Bilateral  Result Date: 09/21/2018 CLINICAL DATA:  TIA symptoms, small acute right caudate nucleus infarct EXAM: BILATERAL CAROTID DUPLEX ULTRASOUND TECHNIQUE: Wallace Cullens scale imaging, color Doppler and duplex ultrasound were performed of bilateral carotid and vertebral arteries in the neck. COMPARISON:  09/21/2018 MR FINDINGS: Criteria: Quantification of carotid stenosis is based on velocity parameters that correlate the residual internal carotid diameter with NASCET-based stenosis levels, using the diameter of the distal internal carotid lumen as the denominator for stenosis measurement. The following velocity measurements were obtained: RIGHT ICA: 105/36 cm/sec CCA: 87/14 cm/sec SYSTOLIC ICA/CCA RATIO:  1.2 ECA: 102 cm/sec LEFT ICA: 69/25 cm/sec CCA: 91/16 cm/sec SYSTOLIC ICA/CCA RATIO:  0.8 ECA: 88 cm/sec RIGHT CAROTID ARTERY: Minor echogenic shadowing plaque formation. No hemodynamically significant right ICA stenosis, velocity elevation, or turbulent flow. Degree of narrowing less than 50%. RIGHT VERTEBRAL ARTERY:  Antegrade LEFT CAROTID ARTERY: Similar scattered minor echogenic plaque formation. No hemodynamically significant left ICA stenosis, velocity elevation, or turbulent flow. LEFT VERTEBRAL ARTERY:  Antegrade IMPRESSION: Minor carotid atherosclerosis. No hemodynamically significant ICA  stenosis. Degree of narrowing less than 50% bilaterally by ultrasound criteria. Patent antegrade vertebral flow bilaterally Electronically Signed   By: Judie Petit.  Shick M.D.   On: 09/21/2018 13:42      IMPRESSION AND PLAN:   Jeffrey Pearson  is a 58 y.o. male with a known history of HIV on HAART, hypertension, hypothyroidism status post subtotal thyroidectomy, depression, uncontrolled diabetes mellitus presents to hospital secondary to left-sided weakness and discoordinated movements on the left side this morning.  1.  TIA versus possible CVA-symptoms have completely resolved. -CT of the head negative for any acute findings.  Patient does have chronic small vessel ischemic disease changes.  Likely from uncontrolled diabetes. -Admit under observation.  Neurochecks. -MRI of the brain and MRA.  Carotid Dopplers and echocardiogram -Aspirin and Plavix if positive for stroke.  Stop aspirin after 1 month and continue Plavix.  Lipid panel ordered.  Increase the dose of his statin.  2.  Uncontrolled diabetes mellitus-check A1c.  Continue Tresiba and sliding scale insulin for now  3.  HIV-continue his medication.  Follows with [facility].  He states his last viral RNA counts were undetectable  4.  Hypothyroidism-on Synthroid  5.  DVT prophylaxis-on Lovenox  Independent and ambulatory at baseline   All the records are reviewed and case discussed with ED provider. Management plans discussed with the patient, family and they are in agreement.  CODE STATUS: Full Code  TOTAL TIME TAKING CARE OF THIS PATIENT: 52 minutes.    Enid Baas M.D on 09/21/2018 at 3:30 PM  Between 7am to 6pm - Pager - [phone]  After 6pm go to [email or web address] - Social research officer, government  [facility]  Office  [phone]  CC: Primary care physician; Thana Ates, MD   Note: This dictation was prepared with Dragon dictation along with smaller phrase technology. Any transcriptional  errors that  result from this process are unintentional.

## 2018-09-21 NOTE — ED Notes (Signed)
Report to Jeanette.

## 2018-09-21 NOTE — ED Notes (Signed)
Blood glucose 266

## 2018-09-21 NOTE — ED Provider Notes (Addendum)
[facility] Emergency Department Provider Note  ____________________________________________   I have reviewed the triage vital signs and the nursing notes. Where available I have reviewed prior notes and, if possible and indicated, outside hospital notes.    HISTORY  Chief Complaint Weakness    HPI Jeffrey Pearson is a 58 y.o. male who presents today complaining of "I feel better now".  Patient does have history of diabetes HIV, hypertension hypothyroid, patient seen and evaluated during the coronavirus epidemic during a time with low staffing, he states that he went to bed last night feeling normally, this morning, he woke up and he noted left upper and especially left lower extremity weakness and difficulty speaking.  This was about 445 or 5 AM.  No headache.  He states that it is "now like it is never happened and I feel 100% better".  He denies any ongoing symptoms.  He denies any fever or chills or stiff neck or headache.  He has not filled any infectious symptoms consistent with a coronavirus.  He states that he could move but he had difficulty with his left leg especially.  He has taken his insulin this morning although he states he did have a glass of chocolate nonpitting is able to swallow it with no difficulty.  That was prior to coming in.  Last known well therefore was last night around midnight,    Past Medical History:  Diagnosis Date  . Depression   . Diabetes mellitus without complication (HCC)   . HIV (human immunodeficiency virus infection) (HCC)   . Hypertension   . Hyperthyroidism   . Thyroid disease     Patient Active Problem List   Diagnosis Date Noted  . Acute encephalopathy   . 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 lymph nodes  1982  . THYROIDECTOMY    . THYROIDECTOMY, PARTIAL  1980  . TONSILLECTOMY      Prior to Admission medications   Medication  Sig Start Date End Date Taking? Authorizing Provider  atorvastatin (LIPITOR) 10 MG tablet Take 10 mg by mouth daily.    [provider]  bictegravir-emtricitabine-tenofovir AF (BIKTARVY) 50-200-25 MG TABS tablet Take 1 tablet by mouth daily. 07/16/18   [provider]  citalopram (CELEXA) 40 MG tablet Take 40 mg by mouth daily. 12/08/17 12/08/18  [provider]  DULoxetine (CYMBALTA) 30 MG capsule Take 30 mg by mouth daily.    [provider]  insulin aspart (NOVOLOG FLEXPEN) 100 UNIT/ML FlexPen Inject 0-50 Units into the skin daily. 06/13/18   [provider]  Insulin Degludec (TRESIBA) 100 UNIT/ML SOLN Inject 30 Units into the skin daily.    [provider]  levothyroxine (SYNTHROID) 75 MCG tablet Take 75 mcg by mouth daily before breakfast.    [provider]  lisinopril (ZESTRIL) 10 MG tablet Take 10 mg by mouth daily.    [provider]  traZODone (DESYREL) 100 MG tablet Take 200 mg by mouth Nightly. 06/04/18   [provider]  valACYclovir (VALTREX) 1000 MG tablet Take 50 mg by mouth daily.    [provider]    Allergies Septra [sulfamethoxazole-trimethoprim]  No family history on file.  Social History Social History   Tobacco Use  . Smoking status: Never Smoker  . Smokeless tobacco: Never Used  Substance Use Topics  . Alcohol use: No  . Drug use: Never    Review of Systems Constitutional:  No fever/chills Eyes: No visual changes. ENT: No sore throat. No stiff neck no neck pain Cardiovascular: Denies chest pain. Respiratory: Denies shortness of breath. Gastrointestinal:   no vomiting.  No diarrhea.  No constipation. Genitourinary: Negative for dysuria. Musculoskeletal: Negative lower extremity swelling Skin: Negative for rash. Neurological: Negative for severe headaches, see HPI   ____________________________________________   PHYSICAL EXAM:  VITAL SIGNS: ED Triage Vitals  Enc  Vitals Group     BP 09/21/18 0607 115/79     Pulse Rate 09/21/18 0607 89     Resp 09/21/18 0607 14     Temp 09/21/18 0607 97.6 F (36.4 C)     Temp Source 09/21/18 0607 Oral     SpO2 09/21/18 0607 97 %     Weight 09/21/18 0559 210 lb (95.3 kg)     Height 09/21/18 0559  (1.753 m)     Head Circumference --      Peak Flow --      Pain Score 09/21/18 0559 0     Pain Loc --      Pain Edu? --      Excl. in GC? --     Constitutional: Alert and oriented. Well appearing and in no acute distress. Eyes: Conjunctivae are normal Head: Atraumatic HEENT: No congestion/rhinnorhea. Mucous membranes are moist.  Oropharynx non-erythematous Neck:   Nontender with no meningismus, no masses, no stridor Cardiovascular: Normal rate, regular rhythm. Grossly normal heart sounds.  Good peripheral circulation. Respiratory: Normal respiratory effort.  No retractions. Lungs CTAB. Abdominal: Soft and nontender. No distention. No guarding no rebound Back:  There is no focal tenderness or step off.  there is no midline tenderness there are no lesions noted. there is no CVA tenderness Musculoskeletal: No lower extremity tenderness, no upper extremity tenderness. No joint effusions, no DVT signs strong distal pulses no edema Neurologic: Cranial nerves II through XII are grossly intact 5 out of 5 strength bilateral upper and lower extremity. Finger to nose within normal limits heel to shin within normal limits, speech is normal with no word finding difficulty or dysarthria, reflexes symmetric, pupils are equally round and reactive to light, there is no pronator drift, sensation is normal, vision is intact to confrontation, gait is deferred, there is no nystagmus, normal neurologic exam NIH stroke scale 0 Skin:  Skin is warm, dry and intact. No rash noted. Psychiatric: Mood and affect are normal. Speech and behavior are normal.  ____________________________________________   LABS (all labs ordered are listed, but  only abnormal results are displayed)  Labs Reviewed  ETHANOL  PROTIME-INR  APTT  CBC  DIFFERENTIAL  COMPREHENSIVE METABOLIC PANEL  URINE DRUG SCREEN, QUALITATIVE ([facility] ONLY)  URINALYSIS, ROUTINE W REFLEX MICROSCOPIC    Pertinent labs  results that were available during my care of the patient were reviewed by me and considered in my medical decision making (see chart for details). ____________________________________________  EKG  I personally interpreted any EKGs ordered by me or triage Sinus rhythm rate 88 bpm, normal axis no acute ST elevation or depression. ____________________________________________  RADIOLOGY  Pertinent labs & imaging results that were available during my care of the patient were reviewed by me and considered in my medical decision making (see chart for details). If possible, patient and/or family made aware of any abnormal findings.  No results found. ____________________________________________    PROCEDURES  Procedure(s) performed: None  Procedures  Critical Care performed: None  ____________________________________________   INITIAL IMPRESSION / ASSESSMENT AND PLAN / ED  COURSE  Pertinent labs & imaging results that were available during my care of the patient were reviewed by me and considered in my medical decision making (see chart for details).  Patient here for left-sided CVA symptoms.  Fortunately, he is back to baseline at this time.  Is an NIH stroke scale 0 he is not a candidate for TPA.  We will obtain CT scan of his head.  Time of onset is not precisely known his last known well midnight, nearly 600 hours ago.  He notices symptoms at around 5 AM he believes.  His sugar is elevated but he did have A milk and did not have his insulin.  We will monitor that.  I will obtain CT scan, and anticipate the patient will be admitted to the hospital.  Hospital policy during the coronavirus, we are going to test him for coronavirus prior to  admission.  Patient is very well educated and is in agreement with this plan.   ----------------------------------------- 7:09 AM on 09/21/2018 ----------------------------------------- Admitted to hospitalist service, Dr. Marcene Duoskalliseti appreciate consult    ____________________________________________   FINAL CLINICAL IMPRESSION(S) / ED DIAGNOSES  Final diagnoses:  None      This chart was dictated using voice recognition software.  Despite best efforts to proofread,  errors can occur which can change meaning.      Jeanmarie PlantMcShane, Reyan Helle A, MD 09/21/18 [id number]    Jeanmarie PlantMcShane, Mikiah Durall A, MD 09/21/18 [phone]

## 2018-09-21 NOTE — ED Notes (Signed)
Pt transported to MRI 

## 2018-09-21 NOTE — ED Notes (Signed)
Report called and given to receiving nurse.  

## 2018-09-21 NOTE — ED Notes (Signed)
Scheduled insulin given, sliding scale held, awaiting food tray. Dietary called for lunch tray not received.

## 2018-09-22 ENCOUNTER — Observation Stay (HOSPITAL_BASED_OUTPATIENT_CLINIC_OR_DEPARTMENT_OTHER)
Admit: 2018-09-22 | Discharge: 2018-09-22 | Disposition: A | Discharge status: Home / Self Care | Payer: Self-pay | Attending: Internal Medicine | Admitting: Internal Medicine

## 2018-09-22 DIAGNOSIS — G459 Transient cerebral ischemic attack, unspecified: Secondary | ICD-10-CM

## 2018-09-22 LAB — CBC
HCT: 37.4 % — ABNORMAL LOW (ref 39.0–52.0)
Hemoglobin: 12.9 g/dL — ABNORMAL LOW (ref 13.0–17.0)
MCH: 32.2 pg (ref 26.0–34.0)
MCHC: 34.5 g/dL (ref 30.0–36.0)
MCV: 93.3 fL (ref 80.0–100.0)
Platelets: 126 10*3/uL — ABNORMAL LOW (ref 150–400)
RBC: 4.01 MIL/uL — ABNORMAL LOW (ref 4.22–5.81)
RDW: 12.3 % (ref 11.5–15.5)
WBC: 3.1 10*3/uL — ABNORMAL LOW (ref 4.0–10.5)
nRBC: 0 % (ref 0.0–0.2)

## 2018-09-22 LAB — ECHOCARDIOGRAM COMPLETE
Height: 69 in
Weight: 3360 oz

## 2018-09-22 LAB — GLUCOSE, CAPILLARY
Glucose-Capillary: 233 mg/dL — ABNORMAL HIGH (ref 70–99)
Glucose-Capillary: 280 mg/dL — ABNORMAL HIGH (ref 70–99)

## 2018-09-22 LAB — BASIC METABOLIC PANEL
Anion gap: 6 (ref 5–15)
BUN: 16 mg/dL (ref 6–20)
CO2: 27 mmol/L (ref 22–32)
Calcium: 8.5 mg/dL — ABNORMAL LOW (ref 8.9–10.3)
Chloride: 101 mmol/L (ref 98–111)
Creatinine, Ser: 0.89 mg/dL (ref 0.61–1.24)
GFR calc Af Amer: >60 mL/min (ref >60)
GFR calc non Af Amer: >60 mL/min (ref >60)
Glucose, Bld: 212 mg/dL — ABNORMAL HIGH (ref 70–99)
Potassium: 4.2 mmol/L (ref 3.5–5.1)
Sodium: 134 mmol/L — ABNORMAL LOW (ref 135–145)

## 2018-09-22 MED ORDER — INSULIN GLARGINE 100 UNIT/ML ~~LOC~~ SOLN
35.0000 [IU] | Freq: Every day | SUBCUTANEOUS | Status: DC
  Administered 2018-09-22: 35 [IU] via SUBCUTANEOUS
  Filled 2018-09-22: qty 0.35

## 2018-09-22 MED ORDER — INSULIN DEGLUDEC 100 UNIT/ML ~~LOC~~ SOLN: 35.0000 [IU] | Freq: Every day | SUBCUTANEOUS | 0 refills | Status: DC

## 2018-09-22 MED ORDER — ASPIRIN 81 MG PO CHEW: 81.0000 mg | CHEWABLE_TABLET | Freq: Every day | ORAL | 0 refills | Status: DC

## 2018-09-22 MED ORDER — ATORVASTATIN CALCIUM 40 MG PO TABS: 40.0000 mg | ORAL_TABLET | Freq: Every day | ORAL | 0 refills | Status: DC

## 2018-09-22 NOTE — Discharge Summary (Signed)
[facility] - [place] [facility]   PATIENT NAME: Jeffrey Pearson    MR#:  [id number]  DATE OF BIRTH:  1960-10-28  DATE OF ADMISSION:  09/21/2018 ADMITTING PHYSICIAN: Enid Baasadhika Kalisetti, MD  DATE OF DISCHARGE: 09/22/2018   PRIMARY CARE PHYSICIAN: Thana AtesEron, Joseph J, MD    ADMISSION DIAGNOSIS:  TIA (transient ischemic attack) [G45.9]  DISCHARGE DIAGNOSIS:  Active Problems:   stroke   SECONDARY DIAGNOSIS:   Past Medical History:  Diagnosis Date  . Depression   . Diabetes mellitus without complication (HCC)   . HIV (human immunodeficiency virus infection) (HCC)   . Hypertension   . Hyperthyroidism   . Thyroid disease     HOSPITAL COURSE:   Jeffrey Pearson  is a 58 y.o. male with a known history of HIV on HAART, hypertension, hypothyroidism status post subtotal thyroidectomy, depression, uncontrolled diabetes mellitus presents to hospital secondary to left-sided weakness and discoordinated movements on the left side this morning.  1.  Acute stroke -CT of the head negative for any acute findings. Patient does have chronic small vessel ischemic disease changes.  Likely from uncontrolled diabetes. -Admit under observation.  Neurochecks. -MRI of the brain and MRA- confirms a small stroke   Carotid Dopplers and echocardiogram done and did not show any contributing abnormalities. -Aspirin started due to positive for stroke.  -Patient's LDL was more than 70 and he was taking 10 mg reviewed also increased to 40 mg on discharge. -Seen by neurologist and agreed with the plan.  Patient was back to normal and was able to walk without any difficulties.  2.  Uncontrolled diabetes mellitus-checked A1c- > 11.  Continue Evaristo Buryresiba and sliding scale insulin for now Increased dose of long-acting insulin on discharge and advised on better dietary control.  3.  HIV-continue his medication.  Follows with [facility].  He states his last viral RNA counts were undetectable  4.   Hypothyroidism-on Synthroid  5.  DVT prophylaxis-on Lovenox  DISCHARGE CONDITIONS:   Stable  CONSULTS OBTAINED:  Treatment Team:  Pauletta BrownsZeylikman, Yuriy, MD  DRUG ALLERGIES:   Allergies  Allergen Reactions  . Septra [Sulfamethoxazole-Trimethoprim] Rash    DISCHARGE MEDICATIONS:   Allergies as of 09/22/2018      Reactions   Septra [sulfamethoxazole-trimethoprim] Rash      Medication List    TAKE these medications   aspirin 81 MG chewable tablet Chew 1 tablet (81 mg total) by mouth daily. Start taking on:  Sep 23, 2018   atorvastatin 40 MG tablet Commonly known as:  LIPITOR Take 1 tablet (40 mg total) by mouth daily at 6 PM. What changed:    medication strength  how much to take  when to take this   Biktarvy 50-200-25 MG Tabs tablet Generic drug:  bictegravir-emtricitabine-tenofovir AF Take 1 tablet by mouth daily.   citalopram 20 MG tablet Commonly known as:  CELEXA Take 20 mg by mouth daily.   DULoxetine 30 MG capsule Commonly known as:  CYMBALTA Take 30 mg by mouth daily.   Insulin Degludec 100 UNIT/ML Soln Commonly known as:  Tresiba Inject 35 Units into the skin daily. What changed:  how much to take   levothyroxine 75 MCG tablet Commonly known as:  SYNTHROID Take 75 mcg by mouth daily before breakfast.   lisinopril 10 MG tablet Commonly known as:  ZESTRIL Take 10 mg by mouth daily.   NovoLOG FlexPen 100 UNIT/ML FlexPen Generic drug:  insulin aspart Inject 0-50 Units into the skin daily.  traZODone 100 MG tablet Commonly known as:  DESYREL Take 200 mg by mouth Nightly.   valACYclovir 1000 MG tablet Commonly known as:  VALTREX Take 500 mg by mouth daily.        DISCHARGE INSTRUCTIONS:   Follow with primary care physician and neurologist in the next 2 to 3 weeks.  If you experience worsening of your admission symptoms, develop shortness of breath, life threatening emergency, suicidal or homicidal thoughts you must seek medical  attention immediately by calling 911 or calling your MD immediately  if symptoms less severe.  You Must read complete instructions/literature along with all the possible adverse reactions/side effects for all the Medicines you take and that have been prescribed to you. Take any new Medicines after you have completely understood and accept all the possible adverse reactions/side effects.   Please note  You were cared for by a hospitalist during your hospital stay. If you have any questions about your discharge medications or the care you received while you were in the hospital after you are discharged, you can call the unit and asked to speak with the hospitalist on call if the hospitalist that took care of you is not available. Once you are discharged, your primary care physician will handle any further medical issues. Please note that NO REFILLS for any discharge medications will be authorized once you are discharged, as it is imperative that you return to your primary care physician (or establish a relationship with a primary care physician if you do not have one) for your aftercare needs so that they can reassess your need for medications and monitor your lab values.    Today   CHIEF COMPLAINT:   Chief Complaint  Patient presents with  . Weakness    HISTORY OF PRESENT ILLNESS:  Jeffrey Pearson  is a 58 y.o. male with a known history of HIV on HAART, hypertension, hypothyroidism status post subtotal thyroidectomy, depression, uncontrolled diabetes mellitus presents to hospital secondary to left-sided weakness and discoordinated movements on the left side this morning. Patient is from [place], currently due to social isolation, he has been staying with his parents and working from home.  He states he was fine when he went to bed last night.  At 530 he woke up as his usual time, felt disoriented felt like he was swelling more towards his right side.  He just felt funny on his left side.  He is  right-handed.  When he opened his fridge to take milk he dropped it with his left hand.  His speech was slightly slurred.  No previous history of strokes.  No fevers or chills.  Intermittent headaches in the last couple of days.  He presented to the emergency room.  He was being admitted for possible TIA.  His symptoms have completely resolved within 1 hour of onset.   VITAL SIGNS:  Blood pressure 134/79, pulse 75, temperature 97.9 F (36.6 C), resp. rate 16, height  (1.753 m), weight 95.3 kg, SpO2 98 %.  I/O:    Intake/Output Summary (Last 24 hours) at 09/22/2018 1321 Last data filed at 09/22/2018 1001 Gross per 24 hour  Intake 480 ml  Output -  Net 480 ml    PHYSICAL EXAMINATION:  GENERAL:  58 y.o.-year-old patient lying in the bed with no acute distress.  EYES: Pupils equal, round, reactive to light and accommodation. No scleral icterus. Extraocular muscles intact.  HEENT: Head atraumatic, normocephalic. Oropharynx and nasopharynx clear.  NECK:  Supple, no jugular  venous distention. No thyroid enlargement, no tenderness.  LUNGS: Normal breath sounds bilaterally, no wheezing, rales,rhonchi or crepitation. No use of accessory muscles of respiration.  CARDIOVASCULAR: S1, S2 normal. No murmurs, rubs, or gallops.  ABDOMEN: Soft, non-tender, non-distended. Bowel sounds present. No organomegaly or mass.  EXTREMITIES: No pedal edema, cyanosis, or clubbing.  NEUROLOGIC: Cranial nerves II through XII are intact. Muscle strength 5/5 in all extremities. Sensation intact. Gait not checked.  PSYCHIATRIC: The patient is alert and oriented x 3.  SKIN: No obvious rash, lesion, or ulcer.   DATA REVIEW:   CBC Recent Labs  Lab 09/22/18 0709  WBC 3.1*  HGB 12.9*  HCT 37.4*  PLT 126*    Chemistries  Recent Labs  Lab 09/21/18 0611 09/22/18 0709  NA 128* 134*  K 4.2 4.2  CL 95* 101  CO2 24 27  GLUCOSE 264* 212*  BUN 16 16  CREATININE 1.13 0.89  CALCIUM 8.0* 8.5*  AST 33  --    ALT 31  --   ALKPHOS 114  --   BILITOT 0.6  --     Cardiac Enzymes No results for input(s): TROPONINI in the last 168 hours.  Microbiology Results  Results for orders placed or performed during the hospital encounter of 09/21/18  SARS Coronavirus 2 (CEPHEID - Performed in [facility] hospital lab), Hosp Order     Status: None   Collection Time: 09/21/18  6:11 AM  Result Value Ref Range Status   SARS Coronavirus 2 NEGATIVE NEGATIVE Final    Comment: (NOTE) If result is NEGATIVE SARS-CoV-2 target nucleic acids are NOT DETECTED. The SARS-CoV-2 RNA is generally detectable in upper and lower  respiratory specimens during the acute phase of infection. The lowest  concentration of SARS-CoV-2 viral copies this assay can detect is 250  copies / mL. A negative result does not preclude SARS-CoV-2 infection  and should not be used as the sole basis for treatment or other  patient management decisions.  A negative result may occur with  improper specimen collection / handling, submission of specimen other  than nasopharyngeal swab, presence of viral mutation(s) within the  areas targeted by this assay, and inadequate number of viral copies  (<250 copies / mL). A negative result must be combined with clinical  observations, patient history, and epidemiological information. If result is POSITIVE SARS-CoV-2 target nucleic acids are DETECTED. The SARS-CoV-2 RNA is generally detectable in upper and lower  respiratory specimens dur ing the acute phase of infection.  Positive  results are indicative of active infection with SARS-CoV-2.  Clinical  correlation with patient history and other diagnostic information is  necessary to determine patient infection status.  Positive results do  not rule out bacterial infection or co-infection with other viruses. If result is PRESUMPTIVE POSTIVE SARS-CoV-2 nucleic acids MAY BE PRESENT.   A presumptive positive result was obtained on the submitted specimen   and confirmed on repeat testing.  While 2019 novel coronavirus  (SARS-CoV-2) nucleic acids may be present in the submitted sample  additional confirmatory testing may be necessary for epidemiological  and / or clinical management purposes  to differentiate between  SARS-CoV-2 and other Sarbecovirus currently known to infect humans.  If clinically indicated additional testing with an alternate test  methodology [phone]) is advised. The SARS-CoV-2 RNA is generally  detectable in upper and lower respiratory sp ecimens during the acute  phase of infection. The expected result is Negative. Fact Sheet for Patients:  BoilerBrush.com.cy Fact Sheet for  Healthcare Providers: https://pope.com/ This test is not yet approved or cleared by the [place] and has been authorized for detection and/or diagnosis of SARS-CoV-2 by FDA under an Emergency Use Authorization (EUA).  This EUA will remain in effect (meaning this test can be used) for the duration of the COVID-19 declaration under Section 564(b)(1) of the Act, 21 U.S.C. section 360bbb-3(b)(1), unless the authorization is terminated or revoked sooner. Performed at [facility], [address] [place], [place] [address]     RADIOLOGY:  Ct Head Wo Contrast  Result Date: 09/21/2018 CLINICAL DATA:  Left upper and lower extremity weakness and difficulty streaky which is now resolved EXAM: CT HEAD WITHOUT CONTRAST TECHNIQUE: Contiguous axial images were obtained from the base of the skull through the vertex without intravenous contrast. COMPARISON:  08/23/2018 FINDINGS: Brain: No evidence of acute infarction, hemorrhage, hydrocephalus, extra-axial collection or mass lesion/mass effect. Low-density in the cerebral white matter attributed to chronic small vessel ischemia given vascular risk factors. Normal brain volume. Vascular: Atherosclerotic calcification.  No hyperdense vessel. Skull:  Negative Sinuses/Orbits: Negative IMPRESSION: 1. No acute finding. 2. Chronic small vessel ischemia. Electronically Signed   By: Marnee Spring M.D.   On: 09/21/2018 06:39   Mr Maxine Glenn Head Wo Contrast  Result Date: 09/21/2018 CLINICAL DATA:  Transient left-sided weakness and slurred speech today. EXAM: MRI HEAD WITHOUT CONTRAST MRA HEAD WITHOUT CONTRAST TECHNIQUE: Multiplanar, multiecho pulse sequences of the brain and surrounding structures were obtained without intravenous contrast. Angiographic images of the head were obtained using MRA technique without contrast. COMPARISON:  Head CT 09/21/2018 FINDINGS: MRI HEAD FINDINGS Brain: There is a thin, curvilinear focus of restricted diffusion measuring 1-1.5 cm in length in the region of the posterior caudate body on the right consistent with an acute infarct. Patchy T2 hyperintensities in the cerebral white matter, thalami, and pons are nonspecific but compatible with moderate chronic small vessel ischemic disease. A small chronic infarct is noted in the cerebellum on the left. No intracranial hemorrhage, mass, midline shift, or extra-axial fluid collection is identified. There is mild cerebral atrophy. Vascular: Major intracranial arterial flow voids are preserved. Abnormal signal in the non-dominant left transverse and sigmoid sinuses and upper left internal jugular vein is favored to reflect slow flow rather than occlusion. Skull and upper cervical spine: Nonspecific calvarial bone marrow heterogeneity without a destructive lesion identified. Sinuses/Orbits: Unremarkable orbits. Small left maxillary sinus mucous retention cyst. Clear mastoid air cells. Other: None. MRA HEAD FINDINGS The visualized distal vertebral arteries are widely patent to the basilar and codominant. Patent PICA, AICA, and SCA origins are identified bilaterally. The basilar artery is widely patent. There are small right and possibly small left posterior communicating arteries. PCAs are  patent without evidence of significant proximal stenosis. The internal carotid arteries are widely patent from skull base to carotid termini. ACAs and MCAs are patent without evidence of proximal branch occlusion or flow limiting proximal stenosis. The appearance of mild bilateral M1 and right ACA origin stenoses is favored to be secondary to mild motion artifact. No aneurysm is identified. IMPRESSION: 1. 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 2. Moderate chronic small vessel ischemic disease. 3. Patent anterior and posterior intracranial arterial circulation without evidence of major branch occlusion or significant proximal stenosis. Electronically Signed   By: Sebastian Ache M.D.   On: 09/21/2018 11:12   Mr Brain Wo Contrast  Result Date: 09/21/2018 CLINICAL DATA:  Transient left-sided weakness and slurred speech today. EXAM: MRI HEAD  WITHOUT CONTRAST MRA HEAD WITHOUT CONTRAST TECHNIQUE: Multiplanar, multiecho pulse sequences of the brain and surrounding structures were obtained without intravenous contrast. Angiographic images of the head were obtained using MRA technique without contrast. COMPARISON:  Head CT 09/21/2018 FINDINGS: MRI HEAD FINDINGS Brain: There is a thin, curvilinear focus of restricted diffusion measuring 1-1.5 cm in length in the region of the posterior caudate body on the right consistent with an acute infarct. Patchy T2 hyperintensities in the cerebral white matter, thalami, and pons are nonspecific but compatible with moderate chronic small vessel ischemic disease. A small chronic infarct is noted in the cerebellum on the left. No intracranial hemorrhage, mass, midline shift, or extra-axial fluid collection is identified. There is mild cerebral atrophy. Vascular: Major intracranial arterial flow voids are preserved. Abnormal signal in the non-dominant left transverse and sigmoid sinuses and upper left internal jugular vein is favored to reflect slow flow rather than occlusion.  Skull and upper cervical spine: Nonspecific calvarial bone marrow heterogeneity without a destructive lesion identified. Sinuses/Orbits: Unremarkable orbits. Small left maxillary sinus mucous retention cyst. Clear mastoid air cells. Other: None. MRA HEAD FINDINGS The visualized distal vertebral arteries are widely patent to the basilar and codominant. Patent PICA, AICA, and SCA origins are identified bilaterally. The basilar artery is widely patent. There are small right and possibly small left posterior communicating arteries. PCAs are patent without evidence of significant proximal stenosis. The internal carotid arteries are widely patent from skull base to carotid termini. ACAs and MCAs are patent without evidence of proximal branch occlusion or flow limiting proximal stenosis. The appearance of mild bilateral M1 and right ACA origin stenoses is favored to be secondary to mild motion artifact. No aneurysm is identified. IMPRESSION: 1. 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 2. Moderate chronic small vessel ischemic disease. 3. Patent anterior and posterior intracranial arterial circulation without evidence of major branch occlusion or significant proximal stenosis. Electronically Signed   By: Sebastian Ache M.D.   On: 09/21/2018 11:12   US Carotid Bilateral  Result Date: 09/21/2018 CLINICAL DATA:  TIA symptoms, small acute right caudate nucleus infarct EXAM: BILATERAL CAROTID DUPLEX ULTRASOUND TECHNIQUE: Wallace Cullens scale imaging, color Doppler and duplex ultrasound were performed of bilateral carotid and vertebral arteries in the neck. COMPARISON:  09/21/2018 MR FINDINGS: Criteria: Quantification of carotid stenosis is based on velocity parameters that correlate the residual internal carotid diameter with NASCET-based stenosis levels, using the diameter of the distal internal carotid lumen as the denominator for stenosis measurement. The following velocity measurements were obtained: RIGHT ICA: 105/36  cm/sec CCA: 87/14 cm/sec SYSTOLIC ICA/CCA RATIO:  1.2 ECA: 102 cm/sec LEFT ICA: 69/25 cm/sec CCA: 91/16 cm/sec SYSTOLIC ICA/CCA RATIO:  0.8 ECA: 88 cm/sec RIGHT CAROTID ARTERY: Minor echogenic shadowing plaque formation. No hemodynamically significant right ICA stenosis, velocity elevation, or turbulent flow. Degree of narrowing less than 50%. RIGHT VERTEBRAL ARTERY:  Antegrade LEFT CAROTID ARTERY: Similar scattered minor echogenic plaque formation. No hemodynamically significant left ICA stenosis, velocity elevation, or turbulent flow. LEFT VERTEBRAL ARTERY:  Antegrade IMPRESSION: Minor carotid atherosclerosis. No hemodynamically significant ICA stenosis. Degree of narrowing less than 50% bilaterally by ultrasound criteria. Patent antegrade vertebral flow bilaterally Electronically Signed   By: Judie Petit.  Shick M.D.   On: 09/21/2018 13:42    EKG:   Orders placed or performed during the hospital encounter of 09/21/18  . ED EKG  . ED EKG  . ED EKG  . ED EKG  . EKG 12-Lead  . EKG 12-Lead  Management plans discussed with the patient, family and they are in agreement.  CODE STATUS:     Code Status Orders  (From admission, onward)         Start     Ordered   09/21/18 0915  Full code  Continuous     09/21/18 0914        Code Status History    Date Active Date Inactive Code Status Order ID Comments User Context   08/23/2018 2148 08/25/2018 1521 Full Code [id number]  Mayo, Allyn Kenner, MD Inpatient      TOTAL TIME TAKING CARE OF THIS PATIENT: 35 minutes.    Altamese Dilling M.D on 09/22/2018 at 1:21 PM  Between 7am to 6pm - Pager - [phone]  After 6pm go to [email or web address] - password [email or web address]  [facility]  Office  [phone]  CC: Primary care physician; Thana Ates, MD   Note: This dictation was prepared with Dragon dictation along with smaller phrase technology. Any transcriptional errors that result from this process are unintentional.

## 2018-09-22 NOTE — Progress Notes (Signed)
OT Cancellation Note  Patient Details Name: Jeffrey Pearson MRN: [id number] DOB: 05/25/1960   Cancelled Treatment:    Reason Eval/Treat Not Completed: OT screened, no needs identified, will sign off. Order received, chart reviewed. Pt back to baseline functional independence. No skilled OT needs identified. Will sign off. Please re-consult if additional needs arise.   Richrd Prime, MPH, MS, OTR/L ascom [phone] 09/22/18, 11:33 AM

## 2018-09-22 NOTE — Consult Note (Signed)
Reason for Consult: left sided weakness  Referring Physician: Dr. Elisabeth Pigeon   CC: L sided weakness   HPI: Jeffrey Pearson is an 58 y.o. male HIV on HAART, hypertension, hypothyroidism status post subtotal thyroidectomy, depression, uncontrolled diabetes mellitus presents to hospital secondary to left-sided weakness and discoordinated movements on the left side this morning.Symptoms lasted for an hr and now resolved. Pt states due to COVID he has been in isolation and sedentary lifestyle which is not usual for him.    Past Medical History:  Diagnosis Date  . Depression   . Diabetes mellitus without complication (HCC)   . HIV (human immunodeficiency virus infection) (HCC)   . Hypertension   . Hyperthyroidism   . Thyroid disease     Past Surgical History:  Procedure Laterality Date  . lymph nodes  1982  . THYROIDECTOMY    . THYROIDECTOMY, PARTIAL  1980  . TONSILLECTOMY      Family History  Problem Relation Age of Onset  . Dementia Mother   . Prostate cancer Father     Social History:  reports that he has never smoked. He has never used smokeless tobacco. He reports that he does not drink alcohol or use drugs.  Allergies  Allergen Reactions  . Septra [Sulfamethoxazole-Trimethoprim] Rash    Medications: I have reviewed the patient's current medications.  ROS: History obtained from the patient  General ROS: negative for - chills, fatigue, fever, night sweats, weight gain or weight loss Psychological ROS: negative for - behavioral disorder, hallucinations, memory difficulties, mood swings or suicidal ideation Ophthalmic ROS: negative for - blurry vision, double vision, eye pain or loss of vision ENT ROS: negative for - epistaxis, nasal discharge, oral lesions, sore throat, tinnitus or vertigo Allergy and Immunology ROS: negative for - hives or itchy/watery eyes Hematological and Lymphatic ROS: negative for - bleeding problems, bruising or swollen lymph nodes Endocrine ROS:  negative for - galactorrhea, hair pattern changes, polydipsia/polyuria or temperature intolerance Respiratory ROS: negative for - cough, hemoptysis, shortness of breath or wheezing Cardiovascular ROS: negative for - chest pain, dyspnea on exertion, edema or irregular heartbeat Gastrointestinal ROS: negative for - abdominal pain, diarrhea, hematemesis, nausea/vomiting or stool incontinence Genito-Urinary ROS: negative for - dysuria, hematuria, incontinence or urinary frequency/urgency Musculoskeletal ROS: negative for - joint swelling or muscular weakness Neurological ROS: as noted in HPI Dermatological ROS: negative for rash and skin lesion changes  Physical Examination: Blood pressure 134/79, pulse 75, temperature 97.9 F (36.6 C), resp. rate 16, height  (1.753 m), weight 95.3 kg, SpO2 98 %.  Neurological Examination   Mental Status: Alert, oriented, thought content appropriate.  Speech fluent without evidence of aphasia.  Able to follow 3 step commands without difficulty. Cranial Nerves: II: Discs flat bilaterally; Visual fields grossly normal, pupils equal, round, reactive to light and accommodation III,IV, VI: ptosis not present, extra-ocular motions intact bilaterally V,VII: smile symmetric, facial light touch sensation normal bilaterally VIII: hearing normal bilaterally IX,X: gag reflex present XI: bilateral shoulder shrug XII: midline tongue extension Motor: Right : Upper extremity   5/5    Left:     Upper extremity   5/5  Lower extremity   5/5     Lower extremity   5/5 Tone and bulk:normal tone throughout; no atrophy noted Sensory: Pinprick and light touch intact throughout, bilaterally Deep Tendon Reflexes: 2+ and symmetric throughout Plantars: Right: downgoing   Left: downgoing Cerebellar: normal finger-to-nose, normal rapid alternating movements and normal heel-to-shin test Gait: normal gait and station  Laboratory Studies:   Basic Metabolic Panel: Recent  Labs  Lab 09/21/18 0611 09/22/18 0709  NA 128* 134*  K 4.2 4.2  CL 95* 101  CO2 24 27  GLUCOSE 264* 212*  BUN 16 16  CREATININE 1.13 0.89  CALCIUM 8.0* 8.5*    Liver Function Tests: Recent Labs  Lab 09/21/18 0611  AST 33  ALT 31  ALKPHOS 114  BILITOT 0.6  PROT 6.3*  ALBUMIN 3.2*   No results for input(s): LIPASE, AMYLASE in the last 168 hours. No results for input(s): AMMONIA in the last 168 hours.  CBC: Recent Labs  Lab 09/21/18 0611 09/22/18 0709  WBC 3.5* 3.1*  NEUTROABS 1.1*  --   HGB 13.1 12.9*  HCT 38.0* 37.4*  MCV 92.2 93.3  PLT 151 126*    Cardiac Enzymes: No results for input(s): CKTOTAL, CKMB, CKMBINDEX, TROPONINI in the last 168 hours.  BNP: Invalid input(s): POCBNP  CBG: Recent Labs  Lab 09/21/18 0848 09/21/18 1327 09/21/18 1649 09/21/18 2048 09/22/18 0745  GLUCAP 398* 266* 360* 258* 233*    Microbiology: Results for orders placed or performed during the hospital encounter of 09/21/18  SARS Coronavirus 2 (CEPHEID - Performed in [facility] hospital lab), Hosp Order     Status: None   Collection Time: 09/21/18  6:11 AM  Result Value Ref Range Status   SARS Coronavirus 2 NEGATIVE NEGATIVE Final    Comment: (NOTE) If result is NEGATIVE SARS-CoV-2 target nucleic acids are NOT DETECTED. The SARS-CoV-2 RNA is generally detectable in upper and lower  respiratory specimens during the acute phase of infection. The lowest  concentration of SARS-CoV-2 viral copies this assay can detect is 250  copies / mL. A negative result does not preclude SARS-CoV-2 infection  and should not be used as the sole basis for treatment or other  patient management decisions.  A negative result may occur with  improper specimen collection / handling, submission of specimen other  than nasopharyngeal swab, presence of viral mutation(s) within the  areas targeted by this assay, and inadequate number of viral copies  (<250 copies / mL). A negative result must be  combined with clinical  observations, patient history, and epidemiological information. If result is POSITIVE SARS-CoV-2 target nucleic acids are DETECTED. The SARS-CoV-2 RNA is generally detectable in upper and lower  respiratory specimens dur ing the acute phase of infection.  Positive  results are indicative of active infection with SARS-CoV-2.  Clinical  correlation with patient history and other diagnostic information is  necessary to determine patient infection status.  Positive results do  not rule out bacterial infection or co-infection with other viruses. If result is PRESUMPTIVE POSTIVE SARS-CoV-2 nucleic acids MAY BE PRESENT.   A presumptive positive result was obtained on the submitted specimen  and confirmed on repeat testing.  While 2019 novel coronavirus  (SARS-CoV-2) nucleic acids may be present in the submitted sample  additional confirmatory testing may be necessary for epidemiological  and / or clinical management purposes  to differentiate between  SARS-CoV-2 and other Sarbecovirus currently known to infect humans.  If clinically indicated additional testing with an alternate test  methodology [phone]) is advised. The SARS-CoV-2 RNA is generally  detectable in upper and lower respiratory sp ecimens during the acute  phase of infection. The expected result is Negative. Fact Sheet for Patients:  BoilerBrush.com.cy Fact Sheet for Healthcare Providers: https://pope.com/ This test is not yet approved or cleared by the [place] FDA and has been authorized  for detection and/or diagnosis of SARS-CoV-2 by FDA under an Emergency Use Authorization (EUA).  This EUA will remain in effect (meaning this test can be used) for the duration of the COVID-19 declaration under Section 564(b)(1) of the Act, 21 U.S.C. section 360bbb-3(b)(1), unless the authorization is terminated or revoked sooner. Performed at [facility], [address] [place], [place] [address]     Coagulation Studies: Recent Labs    09/21/18 6045  LABPROT 13.6  INR 1.1    Urinalysis:  Recent Labs  Lab 09/21/18 0641  COLORURINE YELLOW*  LABSPEC 1.021  PHURINE 6.0  GLUCOSEU >=500*  HGBUR NEGATIVE  BILIRUBINUR NEGATIVE  KETONESUR NEGATIVE  PROTEINUR NEGATIVE  NITRITE NEGATIVE  LEUKOCYTESUR NEGATIVE    Lipid Panel:     Component Value Date/Time   CHOL 191 09/21/2018 0611   TRIG 198 (H) 09/21/2018 0611   HDL 69 09/21/2018 0611   CHOLHDL 2.8 09/21/2018 0611   VLDL 40 09/21/2018 0611   LDLCALC 82 09/21/2018 0611    HgbA1C:  Lab Results  Component Value Date   HGBA1C 11.8 (H) 09/21/2018    Urine Drug Screen:      Component Value Date/Time   LABOPIA NONE DETECTED 09/21/2018 0641   COCAINSCRNUR NONE DETECTED 09/21/2018 0641   LABBENZ NONE DETECTED 09/21/2018 0641   AMPHETMU NONE DETECTED 09/21/2018 0641   THCU NONE DETECTED 09/21/2018 0641   LABBARB NONE DETECTED 09/21/2018 0641    Alcohol Level:  Recent Labs  Lab 09/21/18 0641  ETH <10    Other results: EKG: normal EKG, normal sinus rhythm, unchanged from previous tracings.  Imaging: Ct Head Wo Contrast  Result Date: 09/21/2018 CLINICAL DATA:  Left upper and lower extremity weakness and difficulty streaky which is now resolved EXAM: CT HEAD WITHOUT CONTRAST TECHNIQUE: Contiguous axial images were obtained from the base of the skull through the vertex without intravenous contrast. COMPARISON:  08/23/2018 FINDINGS: Brain: No evidence of acute infarction, hemorrhage, hydrocephalus, extra-axial collection or mass lesion/mass effect. Low-density in the cerebral white matter attributed to chronic small vessel ischemia given vascular risk factors. Normal brain volume. Vascular: Atherosclerotic calcification.  No hyperdense vessel. Skull: Negative Sinuses/Orbits: Negative IMPRESSION: 1. No acute finding. 2. Chronic small vessel ischemia. Electronically  Signed   By: Marnee Spring M.D.   On: 09/21/2018 06:39   Mr Maxine Glenn Head Wo Contrast  Result Date: 09/21/2018 CLINICAL DATA:  Transient left-sided weakness and slurred speech today. EXAM: MRI HEAD WITHOUT CONTRAST MRA HEAD WITHOUT CONTRAST TECHNIQUE: Multiplanar, multiecho pulse sequences of the brain and surrounding structures were obtained without intravenous contrast. Angiographic images of the head were obtained using MRA technique without contrast. COMPARISON:  Head CT 09/21/2018 FINDINGS: MRI HEAD FINDINGS Brain: There is a thin, curvilinear focus of restricted diffusion measuring 1-1.5 cm in length in the region of the posterior caudate body on the right consistent with an acute infarct. Patchy T2 hyperintensities in the cerebral white matter, thalami, and pons are nonspecific but compatible with moderate chronic small vessel ischemic disease. A small chronic infarct is noted in the cerebellum on the left. No intracranial hemorrhage, mass, midline shift, or extra-axial fluid collection is identified. There is mild cerebral atrophy. Vascular: Major intracranial arterial flow voids are preserved. Abnormal signal in the non-dominant left transverse and sigmoid sinuses and upper left internal jugular vein is favored to reflect slow flow rather than occlusion. Skull and upper cervical spine: Nonspecific calvarial bone marrow heterogeneity without a destructive lesion identified. Sinuses/Orbits: Unremarkable orbits. Small  left maxillary sinus mucous retention cyst. Clear mastoid air cells. Other: None. MRA HEAD FINDINGS The visualized distal vertebral arteries are widely patent to the basilar and codominant. Patent PICA, AICA, and SCA origins are identified bilaterally. The basilar artery is widely patent. There are small right and possibly small left posterior communicating arteries. PCAs are patent without evidence of significant proximal stenosis. The internal carotid arteries are widely patent from skull base  to carotid termini. ACAs and MCAs are patent without evidence of proximal branch occlusion or flow limiting proximal stenosis. The appearance of mild bilateral M1 and right ACA origin stenoses is favored to be secondary to mild motion artifact. No aneurysm is identified. IMPRESSION: 1. 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 2. Moderate chronic small vessel ischemic disease. 3. Patent anterior and posterior intracranial arterial circulation without evidence of major branch occlusion or significant proximal stenosis. Electronically Signed   By: Sebastian Ache M.D.   On: 09/21/2018 11:12   Mr Brain Wo Contrast  Result Date: 09/21/2018 CLINICAL DATA:  Transient left-sided weakness and slurred speech today. EXAM: MRI HEAD WITHOUT CONTRAST MRA HEAD WITHOUT CONTRAST TECHNIQUE: Multiplanar, multiecho pulse sequences of the brain and surrounding structures were obtained without intravenous contrast. Angiographic images of the head were obtained using MRA technique without contrast. COMPARISON:  Head CT 09/21/2018 FINDINGS: MRI HEAD FINDINGS Brain: There is a thin, curvilinear focus of restricted diffusion measuring 1-1.5 cm in length in the region of the posterior caudate body on the right consistent with an acute infarct. Patchy T2 hyperintensities in the cerebral white matter, thalami, and pons are nonspecific but compatible with moderate chronic small vessel ischemic disease. A small chronic infarct is noted in the cerebellum on the left. No intracranial hemorrhage, mass, midline shift, or extra-axial fluid collection is identified. There is mild cerebral atrophy. Vascular: Major intracranial arterial flow voids are preserved. Abnormal signal in the non-dominant left transverse and sigmoid sinuses and upper left internal jugular vein is favored to reflect slow flow rather than occlusion. Skull and upper cervical spine: Nonspecific calvarial bone marrow heterogeneity without a destructive lesion identified.  Sinuses/Orbits: Unremarkable orbits. Small left maxillary sinus mucous retention cyst. Clear mastoid air cells. Other: None. MRA HEAD FINDINGS The visualized distal vertebral arteries are widely patent to the basilar and codominant. Patent PICA, AICA, and SCA origins are identified bilaterally. The basilar artery is widely patent. There are small right and possibly small left posterior communicating arteries. PCAs are patent without evidence of significant proximal stenosis. The internal carotid arteries are widely patent from skull base to carotid termini. ACAs and MCAs are patent without evidence of proximal branch occlusion or flow limiting proximal stenosis. The appearance of mild bilateral M1 and right ACA origin stenoses is favored to be secondary to mild motion artifact. No aneurysm is identified. IMPRESSION: 1. 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 2. Moderate chronic small vessel ischemic disease. 3. Patent anterior and posterior intracranial arterial circulation without evidence of major branch occlusion or significant proximal stenosis. Electronically Signed   By: Sebastian Ache M.D.   On: 09/21/2018 11:12   US Carotid Bilateral  Result Date: 09/21/2018 CLINICAL DATA:  TIA symptoms, small acute right caudate nucleus infarct EXAM: BILATERAL CAROTID DUPLEX ULTRASOUND TECHNIQUE: Wallace Cullens scale imaging, color Doppler and duplex ultrasound were performed of bilateral carotid and vertebral arteries in the neck. COMPARISON:  09/21/2018 MR FINDINGS: Criteria: Quantification of carotid stenosis is based on velocity parameters that correlate the residual internal carotid diameter with  NASCET-based stenosis levels, using the diameter of the distal internal carotid lumen as the denominator for stenosis measurement. The following velocity measurements were obtained: RIGHT ICA: 105/36 cm/sec CCA: 87/14 cm/sec SYSTOLIC ICA/CCA RATIO:  1.2 ECA: 102 cm/sec LEFT ICA: 69/25 cm/sec CCA: 91/16 cm/sec SYSTOLIC  ICA/CCA RATIO:  0.8 ECA: 88 cm/sec RIGHT CAROTID ARTERY: Minor echogenic shadowing plaque formation. No hemodynamically significant right ICA stenosis, velocity elevation, or turbulent flow. Degree of narrowing less than 50%. RIGHT VERTEBRAL ARTERY:  Antegrade LEFT CAROTID ARTERY: Similar scattered minor echogenic plaque formation. No hemodynamically significant left ICA stenosis, velocity elevation, or turbulent flow. LEFT VERTEBRAL ARTERY:  Antegrade IMPRESSION: Minor carotid atherosclerosis. No hemodynamically significant ICA stenosis. Degree of narrowing less than 50% bilaterally by ultrasound criteria. Patent antegrade vertebral flow bilaterally Electronically Signed   By: Judie PetitM.  Shick M.D.   On: 09/21/2018 13:42     Assessment/Plan:  58 y.o. male HIV on HAART, hypertension, hypothyroidism status post subtotal thyroidectomy, depression, uncontrolled diabetes mellitus presents to hospital secondary to left-sided weakness and discoordinated movements on the left side this morning.Symptoms lasted for an hr and now resolved. Pt states due to COVID he has been in isolation and sedentary lifestyle which is not usual for him.    - MRI with small Caudate stroke likely due to small vessel disease in setting of HTN and DM - ASA 81mg  started as was not on any anti platelet at home.  - Back to baseline - getting echo now - d/c planning today on ASA 81mg  and home lipitor.   Pauletta BrownsZEYLIKMAN, [place]  09/22/2018, 10:39 AM

## 2018-09-22 NOTE — Plan of Care (Signed)
Pt is independent in room and currently has no deficits. Will continue to monitor.

## 2019-03-11 ENCOUNTER — Encounter: Payer: Self-pay | Admitting: Intensive Care

## 2019-03-11 ENCOUNTER — Other Ambulatory Visit: Payer: Self-pay

## 2019-03-11 ENCOUNTER — Emergency Department: Payer: Self-pay

## 2019-03-11 ENCOUNTER — Inpatient Hospital Stay
Admission: EM | Admit: 2019-03-11 | Discharge: 2019-03-12 | DRG: 637 | Discharge status: Against Medical Advice | Payer: Self-pay | Attending: Family Medicine | Admitting: Family Medicine

## 2019-03-11 DIAGNOSIS — E1100 Type 2 diabetes mellitus with hyperosmolarity without nonketotic hyperglycemic-hyperosmolar coma (NKHHC): Principal | ICD-10-CM | POA: Diagnosis present

## 2019-03-11 DIAGNOSIS — E785 Hyperlipidemia, unspecified: Secondary | ICD-10-CM | POA: Diagnosis present

## 2019-03-11 DIAGNOSIS — N179 Acute kidney failure, unspecified: Secondary | ICD-10-CM

## 2019-03-11 DIAGNOSIS — I10 Essential (primary) hypertension: Secondary | ICD-10-CM | POA: Diagnosis present

## 2019-03-11 DIAGNOSIS — Z7982 Long term (current) use of aspirin: Secondary | ICD-10-CM

## 2019-03-11 DIAGNOSIS — Z21 Asymptomatic human immunodeficiency virus [HIV] infection status: Secondary | ICD-10-CM

## 2019-03-11 DIAGNOSIS — R739 Hyperglycemia, unspecified: Secondary | ICD-10-CM

## 2019-03-11 DIAGNOSIS — G9341 Metabolic encephalopathy: Secondary | ICD-10-CM

## 2019-03-11 DIAGNOSIS — Z882 Allergy status to sulfonamides status: Secondary | ICD-10-CM

## 2019-03-11 DIAGNOSIS — G43909 Migraine, unspecified, not intractable, without status migrainosus: Secondary | ICD-10-CM | POA: Diagnosis present

## 2019-03-11 DIAGNOSIS — Z794 Long term (current) use of insulin: Secondary | ICD-10-CM

## 2019-03-11 DIAGNOSIS — E890 Postprocedural hypothyroidism: Secondary | ICD-10-CM | POA: Diagnosis present

## 2019-03-11 DIAGNOSIS — E860 Dehydration: Secondary | ICD-10-CM | POA: Diagnosis present

## 2019-03-11 DIAGNOSIS — Z79890 Hormone replacement therapy (postmenopausal): Secondary | ICD-10-CM

## 2019-03-11 DIAGNOSIS — F329 Major depressive disorder, single episode, unspecified: Secondary | ICD-10-CM | POA: Diagnosis present

## 2019-03-11 DIAGNOSIS — Z20828 Contact with and (suspected) exposure to other viral communicable diseases: Secondary | ICD-10-CM | POA: Diagnosis present

## 2019-03-11 DIAGNOSIS — Z8673 Personal history of transient ischemic attack (TIA), and cerebral infarction without residual deficits: Secondary | ICD-10-CM

## 2019-03-11 DIAGNOSIS — Z8619 Personal history of other infectious and parasitic diseases: Secondary | ICD-10-CM

## 2019-03-11 LAB — COMPREHENSIVE METABOLIC PANEL
ALT: 33 U/L (ref 0–44)
AST: 41 U/L (ref 15–41)
Albumin: 4.1 g/dL (ref 3.5–5.0)
Alkaline Phosphatase: 99 U/L (ref 38–126)
Anion gap: 12 (ref 5–15)
BUN: 44 mg/dL — ABNORMAL HIGH (ref 6–20)
CO2: 24 mmol/L (ref 22–32)
Calcium: 9.6 mg/dL (ref 8.9–10.3)
Chloride: 89 mmol/L — ABNORMAL LOW (ref 98–111)
Creatinine, Ser: 2.08 mg/dL — ABNORMAL HIGH (ref 0.61–1.24)
GFR calc Af Amer: 39 mL/min — ABNORMAL LOW (ref >60)
GFR calc non Af Amer: 34 mL/min — ABNORMAL LOW (ref >60)
Glucose, Bld: 862 mg/dL (ref 70–99)
Potassium: 5.1 mmol/L (ref 3.5–5.1)
Sodium: 125 mmol/L — ABNORMAL LOW (ref 135–145)
Total Bilirubin: 1.2 mg/dL (ref 0.3–1.2)
Total Protein: 7.8 g/dL (ref 6.5–8.1)

## 2019-03-11 LAB — CBC WITH DIFFERENTIAL/PLATELET
Abs Immature Granulocytes: 0.01 10*3/uL (ref 0.00–0.07)
Basophils Absolute: 0.1 10*3/uL (ref 0.0–0.1)
Basophils Relative: 1 %
Eosinophils Absolute: 0.4 10*3/uL (ref 0.0–0.5)
Eosinophils Relative: 8 %
HCT: 39.9 % (ref 39.0–52.0)
Hemoglobin: 14 g/dL (ref 13.0–17.0)
Immature Granulocytes: 0 %
Lymphocytes Relative: 33 %
Lymphs Abs: 1.5 10*3/uL (ref 0.7–4.0)
MCH: 31.4 pg (ref 26.0–34.0)
MCHC: 35.1 g/dL (ref 30.0–36.0)
MCV: 89.5 fL (ref 80.0–100.0)
Monocytes Absolute: 0.3 10*3/uL (ref 0.1–1.0)
Monocytes Relative: 6 %
Neutro Abs: 2.5 10*3/uL (ref 1.7–7.7)
Neutrophils Relative %: 52 %
Platelets: 171 10*3/uL (ref 150–400)
RBC: 4.46 MIL/uL (ref 4.22–5.81)
RDW: 12.4 % (ref 11.5–15.5)
WBC: 4.7 10*3/uL (ref 4.0–10.5)
nRBC: 0 % (ref 0.0–0.2)

## 2019-03-11 LAB — OSMOLALITY: Osmolality: 309 mOsm/kg — ABNORMAL HIGH (ref 275–295)

## 2019-03-11 LAB — BLOOD GAS, VENOUS
Acid-Base Excess: 3.3 mmol/L — ABNORMAL HIGH (ref 0.0–2.0)
Bicarbonate: 29.1 mmol/L — ABNORMAL HIGH (ref 20.0–28.0)
O2 Saturation: 76.9 %
Patient temperature: 37
pCO2, Ven: 48 mmHg (ref 44.0–60.0)
pH, Ven: 7.39 (ref 7.250–7.430)
pO2, Ven: 42 mmHg (ref 32.0–45.0)

## 2019-03-11 LAB — BASIC METABOLIC PANEL
Anion gap: 11 (ref 5–15)
BUN: 41 mg/dL — ABNORMAL HIGH (ref 6–20)
CO2: 22 mmol/L (ref 22–32)
Calcium: 9.3 mg/dL (ref 8.9–10.3)
Chloride: 97 mmol/L — ABNORMAL LOW (ref 98–111)
Creatinine, Ser: 1.65 mg/dL — ABNORMAL HIGH (ref 0.61–1.24)
GFR calc Af Amer: 52 mL/min — ABNORMAL LOW (ref >60)
GFR calc non Af Amer: 45 mL/min — ABNORMAL LOW (ref >60)
Glucose, Bld: 422 mg/dL — ABNORMAL HIGH (ref 70–99)
Potassium: 4.6 mmol/L (ref 3.5–5.1)
Sodium: 130 mmol/L — ABNORMAL LOW (ref 135–145)

## 2019-03-11 LAB — URINALYSIS, COMPLETE (UACMP) WITH MICROSCOPIC
Bacteria, UA: NONE SEEN
Bilirubin Urine: NEGATIVE
Glucose, UA: >=500 mg/dL — AB
Ketones, ur: 5 mg/dL — AB
Leukocytes,Ua: NEGATIVE
Nitrite: NEGATIVE
Protein, ur: NEGATIVE mg/dL
Specific Gravity, Urine: 1.026 (ref 1.005–1.030)
Squamous Epithelial / HPF: NONE SEEN (ref 0–5)
pH: 5 (ref 5.0–8.0)

## 2019-03-11 LAB — GLUCOSE, CAPILLARY
Glucose-Capillary: 306 mg/dL — ABNORMAL HIGH (ref 70–99)
Glucose-Capillary: 414 mg/dL — ABNORMAL HIGH (ref 70–99)
Glucose-Capillary: 438 mg/dL — ABNORMAL HIGH (ref 70–99)

## 2019-03-11 MED ORDER — SODIUM CHLORIDE 0.9 % IV BOLUS
1000.0000 mL | Freq: Once | INTRAVENOUS | Status: AC
  Administered 2019-03-11: 19:00:00 1000 mL via INTRAVENOUS

## 2019-03-11 MED ORDER — ONDANSETRON HCL 4 MG/2ML IJ SOLN: 4.0000 mg | Freq: Four times a day (QID) | INTRAMUSCULAR | Status: DC | PRN

## 2019-03-11 MED ORDER — VALACYCLOVIR HCL 500 MG PO TABS: 500.0000 mg | ORAL_TABLET | Freq: Every day | ORAL | Status: DC

## 2019-03-11 MED ORDER — SODIUM CHLORIDE 0.9 % IV BOLUS: 1000.0000 mL | Freq: Once | INTRAVENOUS | Status: DC

## 2019-03-11 MED ORDER — INSULIN REGULAR(HUMAN) IN NACL 100-0.9 UT/100ML-% IV SOLN
INTRAVENOUS | Status: DC
  Administered 2019-03-11: 23:00:00 3.5 [IU]/h via INTRAVENOUS

## 2019-03-11 MED ORDER — ATORVASTATIN CALCIUM 20 MG PO TABS: 40.0000 mg | ORAL_TABLET | Freq: Every day | ORAL | Status: DC

## 2019-03-11 MED ORDER — DULOXETINE HCL 30 MG PO CPEP
30.0000 mg | ORAL_CAPSULE | Freq: Every day | ORAL | Status: DC
  Filled 2019-03-11: qty 1

## 2019-03-11 MED ORDER — BICTEGRAVIR-EMTRICITAB-TENOFOV 50-200-25 MG PO TABS
1.0000 | ORAL_TABLET | Freq: Every day | ORAL | Status: DC
  Filled 2019-03-11: qty 1

## 2019-03-11 MED ORDER — ONDANSETRON HCL 4 MG PO TABS: 4.0000 mg | ORAL_TABLET | Freq: Four times a day (QID) | ORAL | Status: DC | PRN

## 2019-03-11 MED ORDER — SODIUM CHLORIDE 0.9 % IV SOLN: INTRAVENOUS | Status: DC

## 2019-03-11 MED ORDER — DEXTROSE 50 % IV SOLN
25.0000 mL | INTRAVENOUS | Status: DC | PRN
  Administered 2019-03-12: 07:00:00 25 mL via INTRAVENOUS
  Filled 2019-03-11: qty 50

## 2019-03-11 MED ORDER — INSULIN ASPART 100 UNIT/ML IV SOLN
10.0000 [IU] | Freq: Once | INTRAVENOUS | Status: AC
  Administered 2019-03-11: 10 [IU] via INTRAVENOUS
  Filled 2019-03-11: qty 0.1

## 2019-03-11 MED ORDER — SODIUM CHLORIDE 0.9 % IV SOLN: INTRAVENOUS | Status: AC

## 2019-03-11 MED ORDER — INSULIN DETEMIR 100 UNIT/ML ~~LOC~~ SOLN
25.0000 [IU] | Freq: Two times a day (BID) | SUBCUTANEOUS | Status: DC
  Administered 2019-03-12: 02:00:00 25 [IU] via SUBCUTANEOUS
  Filled 2019-03-11 (×3): qty 0.25

## 2019-03-11 MED ORDER — ACETAMINOPHEN 325 MG PO TABS: 650.0000 mg | ORAL_TABLET | Freq: Four times a day (QID) | ORAL | Status: DC | PRN

## 2019-03-11 MED ORDER — ENOXAPARIN SODIUM 40 MG/0.4ML ~~LOC~~ SOLN
40.0000 mg | SUBCUTANEOUS | Status: DC
  Administered 2019-03-12: 06:00:00 40 mg via SUBCUTANEOUS
  Filled 2019-03-11 (×2): qty 0.4

## 2019-03-11 MED ORDER — INSULIN REGULAR(HUMAN) IN NACL 100-0.9 UT/100ML-% IV SOLN: INTRAVENOUS | Status: DC

## 2019-03-11 MED ORDER — SODIUM CHLORIDE 0.9 % IV BOLUS
1000.0000 mL | Freq: Once | INTRAVENOUS | Status: AC
  Administered 2019-03-11: 1000 mL via INTRAVENOUS

## 2019-03-11 MED ORDER — ASPIRIN 81 MG PO CHEW: 81.0000 mg | CHEWABLE_TABLET | Freq: Every day | ORAL | Status: DC

## 2019-03-11 MED ORDER — DEXTROSE-NACL 5-0.45 % IV SOLN: INTRAVENOUS | Status: DC

## 2019-03-11 MED ORDER — ACETAMINOPHEN 650 MG RE SUPP: 650.0000 mg | Freq: Four times a day (QID) | RECTAL | Status: DC | PRN

## 2019-03-11 MED ORDER — INSULIN REGULAR BOLUS VIA INFUSION
0.0000 [IU] | Freq: Three times a day (TID) | INTRAVENOUS | Status: DC
  Filled 2019-03-11: qty 10

## 2019-03-11 MED ORDER — SODIUM CHLORIDE 0.9 % IV SOLN
INTRAVENOUS | Status: DC
  Administered 2019-03-12: 02:00:00 via INTRAVENOUS

## 2019-03-11 MED ORDER — CITALOPRAM HYDROBROMIDE 20 MG PO TABS: 40.0000 mg | ORAL_TABLET | Freq: Every day | ORAL | Status: DC

## 2019-03-11 MED ORDER — DEXTROSE-NACL 5-0.45 % IV SOLN
INTRAVENOUS | Status: DC
  Administered 2019-03-12: 03:00:00 via INTRAVENOUS

## 2019-03-11 MED ORDER — MAGNESIUM HYDROXIDE 400 MG/5ML PO SUSP: 30.0000 mL | Freq: Every day | ORAL | Status: DC | PRN

## 2019-03-11 MED ORDER — LEVOTHYROXINE SODIUM 50 MCG PO TABS: 125.0000 ug | ORAL_TABLET | Freq: Every day | ORAL | Status: DC

## 2019-03-11 MED ORDER — TRAZODONE HCL 50 MG PO TABS
25.0000 mg | ORAL_TABLET | Freq: Every evening | ORAL | Status: DC | PRN
  Filled 2019-03-11: qty 1

## 2019-03-11 MED ORDER — TRAZODONE HCL 100 MG PO TABS
200.0000 mg | ORAL_TABLET | Freq: Every evening | ORAL | Status: DC
  Administered 2019-03-12: 02:00:00 200 mg via ORAL
  Filled 2019-03-11: qty 2

## 2019-03-11 NOTE — ED Triage Notes (Addendum)
Patient c/o memory loss, migraine, and vision changes X3 days. Denies injury/LOC.

## 2019-03-11 NOTE — ED Provider Notes (Signed)
[facility] Emergency Department Provider Note  ____________________________________________  Time seen: Approximately 5:38 PM  I have reviewed the triage vital signs and the nursing notes.   HISTORY  Chief Complaint Memory Loss and Migraine   HPI Jeffrey Pearson is a 58 y.o. male presents to the emergency department for treatment and evaluation of headache, vision changes and feeling in a "fog" for the past 3 days. No alleviating measures prior to arrival. Patient states that he has a "stroke" in September and wants a CT to "make sure" he hasn't had another one. He denies new weakness. No medications have been taken for his headache. Vision is "less clear" than usual and he has to "focus hard" in order to make out objects.   Past Medical History:  Diagnosis Date  . Depression   . Diabetes mellitus without complication ([place])   . HIV (human immunodeficiency virus infection) ([place])   . Hypertension   . Hyperthyroidism   . Thyroid disease     Patient Active Problem List   Diagnosis Date Noted  . TIA (transient ischemic attack) 09/21/2018  . Acute encephalopathy   . 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 lymph nodes  1982  . THYROIDECTOMY    . THYROIDECTOMY, PARTIAL  1980  . TONSILLECTOMY      Prior to Admission medications   Medication Sig Start Date End Date Taking? Authorizing Provider  aspirin 81 MG chewable tablet Chew 1 tablet (81 mg total) by mouth daily. 09/23/18   Vaughan Basta, MD  atorvastatin (LIPITOR) 40 MG tablet Take 1 tablet (40 mg total) by mouth daily at 6 PM. 09/22/18   Vaughan Basta, MD  bictegravir-emtricitabine-tenofovir AF (BIKTARVY) 50-200-25 MG TABS tablet Take 1 tablet by mouth daily. 07/16/18   [provider]  citalopram (CELEXA) 20 MG tablet Take 20 mg by mouth daily.  12/08/17 12/08/18  [provider]  DULoxetine  (CYMBALTA) 30 MG capsule Take 30 mg by mouth daily.    [provider]  insulin aspart (NOVOLOG FLEXPEN) 100 UNIT/ML FlexPen Inject 0-50 Units into the skin daily. 06/13/18   [provider]  Insulin Degludec (TRESIBA) 100 UNIT/ML SOLN Inject 35 Units into the skin daily. 09/22/18   Vaughan Basta, MD  levothyroxine (SYNTHROID) 75 MCG tablet Take 75 mcg by mouth daily before breakfast.    [provider]  lisinopril (ZESTRIL) 10 MG tablet Take 10 mg by mouth daily.    [provider]  traZODone (DESYREL) 100 MG tablet Take 200 mg by mouth Nightly. 06/04/18   [provider]  valACYclovir (VALTREX) 1000 MG tablet Take 500 mg by mouth daily.     [provider]    Allergies Septra [sulfamethoxazole-trimethoprim]  Family History  Problem Relation Age of Onset  . Dementia Mother   . Prostate cancer Father     Social History Social History   Tobacco Use  . Smoking status: Never Smoker  . Smokeless tobacco: Never Used  Substance Use Topics  . Alcohol use: No  . Drug use: Never    Review of Systems Constitutional: Negative for fever. ENT: Negative for sore throat. Respiratory: Negative for cough Gastrointestinal: No abdominal pain.  No nausea, no vomiting.  No diarrhea.  Musculoskeletal: Negative for generalized body aches. Skin: Negatiave for rash/lesion/wound. Neurological: Positive for headaches, negative for focal weakness or numbness.  ____________________________________________   PHYSICAL EXAM:  VITAL SIGNS: ED Triage Vitals  Enc  Vitals Group     BP 03/11/19 1721 115/82     Pulse Rate 03/11/19 1721 (!) 104     Resp 03/11/19 1721 16     Temp 03/11/19 1721 98.8 F (37.1 C)     Temp Source 03/11/19 1721 Oral     SpO2 03/11/19 1721 98 %     Weight 03/11/19 1722 220 lb (99.8 kg)     Height 03/11/19 1722 5\' 9"  (1.753 m)     Head Circumference --      Peak Flow --      Pain Score 03/11/19 1721 9     Pain Loc  --      Pain Edu? --      Excl. in GC? --     Constitutional: Alert and oriented. Well appearing and in no acute distress. Eyes: Conjunctivae are normal. PERRL. EOMI. Pupils 66mm and brisk Head: Atraumatic. Nose: No congestion/rhinnorhea. Mouth/Throat: Mucous membranes are moist. Neck: No stridor.  Cardiovascular: Normal rate, regular rhythm. Good peripheral circulation. Respiratory: Normal respiratory effort. Musculoskeletal: Full ROM throughout.  Neurologic:  Normal speech and language. No gross focal neurologic deficits are appreciated. Speech is normal. No gait instability. Cranial nerves II-IIX intact as tested. Skin:  Skin is warm, dry and intact. No rash noted. Psychiatric: Mood and affect are normal. Speech and behavior are normal.  ____________________________________________   LABS (all labs ordered are listed, but only abnormal results are displayed)  Labs Reviewed  COMPREHENSIVE METABOLIC PANEL - Abnormal; Notable for the following components:      Result Value   Sodium 125 (*)    Chloride 89 (*)    Glucose, Bld 862 (*)    BUN 44 (*)    Creatinine, Ser 2.08 (*)    GFR calc non Af Amer 34 (*)    GFR calc Af Amer 39 (*)    All other components within normal limits  URINALYSIS, COMPLETE (UACMP) WITH MICROSCOPIC - Abnormal; Notable for the following components:   Color, Urine STRAW (*)    APPearance CLEAR (*)    Glucose, UA >=500 (*)    Hgb urine dipstick SMALL (*)    Ketones, ur 5 (*)    All other components within normal limits  GLUCOSE, CAPILLARY - Abnormal; Notable for the following components:   Glucose-Capillary 438 (*)    All other components within normal limits  SARS CORONAVIRUS 2 (TAT 6-24 HRS)  CBC WITH DIFFERENTIAL/PLATELET  BLOOD GAS, VENOUS  OSMOLALITY  CBG MONITORING, ED   ____________________________________________  EKG  Not  indicated. ____________________________________________  RADIOLOGY  Pending ____________________________________________   PROCEDURES  None ____________________________________________   INITIAL IMPRESSION / ASSESSMENT AND PLAN / ED COURSE     Patient was advised to remain in waiting room until results of CT are back..   Medical screening exam complete. Further evaluation and work up may be required. ____________________________________________   FINAL CLINICAL IMPRESSION(S) / ED DIAGNOSES  Final diagnoses:  Hyperglycemia  AKI (acute kidney injury) (HCC)       3m, FNP 03/11/19 2213    2214, MD 03/11/19 2239

## 2019-03-11 NOTE — ED Provider Notes (Signed)
[facility] Emergency Department Provider Note    First MD Initiated Contact with Patient 03/11/19 2134     (approximate)  I have reviewed the triage vital signs and the nursing notes.   HISTORY  Chief Complaint Memory Loss and Migraine    HPI Jeffrey Pearson is a 58 y.o. male the below listed past medical history of previous admissions to hospital for hyperglycemic nonketotic hyperosmolar state presents the ER for several days of blurry vision confusion migraine headache dehydration.  States he is not sure whether he has been compliant with his medications because he is been feeling so forgetful.  States he feels very dehydrated.  States he is having trouble keeping food down secondary to nausea and has not wanted to eat due to headache.  Denies any fevers.  No chest pain or shortness of breath.  States pain compliant with his HIV medication regimen.     Past Medical History:  Diagnosis Date  . Depression   . Diabetes mellitus without complication ([place])   . HIV (human immunodeficiency virus infection) ([place])   . Hypertension   . Hyperthyroidism   . Thyroid disease    Family History  Problem Relation Age of Onset  . Dementia Mother   . Prostate cancer Father    Past Surgical History:  Procedure Laterality Date  . lymph nodes  1982  . THYROIDECTOMY    . THYROIDECTOMY, PARTIAL  1980  . TONSILLECTOMY     Patient Active Problem List   Diagnosis Date Noted  . TIA (transient ischemic attack) 09/21/2018  . Acute encephalopathy   . Hyperglycemic hyperosmolar nonketotic coma ([place]) 08/23/2018      Prior to Admission medications   Medication Sig Start Date End Date Taking? Authorizing Provider  aspirin 81 MG chewable tablet Chew 1 tablet (81 mg total) by mouth daily. 09/23/18   Vaughan Basta, MD  atorvastatin (LIPITOR) 40 MG tablet Take 1 tablet (40 mg total) by mouth daily at 6 PM. 09/22/18   Vaughan Basta, MD   bictegravir-emtricitabine-tenofovir AF (BIKTARVY) 50-200-25 MG TABS tablet Take 1 tablet by mouth daily. 07/16/18   [provider]  citalopram (CELEXA) 20 MG tablet Take 20 mg by mouth daily.  12/08/17 12/08/18  [provider]  DULoxetine (CYMBALTA) 30 MG capsule Take 30 mg by mouth daily.    [provider]  insulin aspart (NOVOLOG FLEXPEN) 100 UNIT/ML FlexPen Inject 0-50 Units into the skin daily. 06/13/18   [provider]  Insulin Degludec (TRESIBA) 100 UNIT/ML SOLN Inject 35 Units into the skin daily. 09/22/18   Vaughan Basta, MD  levothyroxine (SYNTHROID) 75 MCG tablet Take 75 mcg by mouth daily before breakfast.    [provider]  lisinopril (ZESTRIL) 10 MG tablet Take 10 mg by mouth daily.    [provider]  traZODone (DESYREL) 100 MG tablet Take 200 mg by mouth Nightly. 06/04/18   [provider]  valACYclovir (VALTREX) 1000 MG tablet Take 500 mg by mouth daily.     [provider]    Allergies Septra [sulfamethoxazole-trimethoprim]    Social History Social History   Tobacco Use  . Smoking status: Never Smoker  . Smokeless tobacco: Never Used  Substance Use Topics  . Alcohol use: No  . Drug use: Never    Review of Systems Patient denies headaches, rhinorrhea, blurry vision, numbness, shortness of breath, chest pain, edema, cough, abdominal pain, nausea, vomiting, diarrhea, dysuria, fevers, rashes or hallucinations unless otherwise stated above in  HPI. ____________________________________________   PHYSICAL EXAM:  VITAL SIGNS: Vitals:   03/11/19 1721  BP: 115/82  Pulse: (!) 104  Resp: 16  Temp: 98.8 F (37.1 C)  SpO2: 98%    Constitutional: Alert and oriented.  Eyes: Conjunctivae are normal.  Head: Atraumatic. Nose: No congestion/rhinnorhea. Mouth/Throat: Mucous membranes are moist.   Neck: No stridor. Painless ROM.  Cardiovascular: Normal rate, regular rhythm. Grossly normal heart  sounds.  Good peripheral circulation. Respiratory: Normal respiratory effort.  No retractions. Lungs CTAB. Gastrointestinal: Soft and nontender. No distention. No abdominal bruits. No CVA tenderness. Genitourinary:  Musculoskeletal: No lower extremity tenderness nor edema.  No joint effusions. Neurologic:  Normal speech and language. No gross focal neurologic deficits are appreciated. No facial droop Skin:  Skin is warm, dry and intact. No rash noted. Psychiatric: Mood and affect are normal. Speech and behavior are normal.  ____________________________________________   LABS (all labs ordered are listed, but only abnormal results are displayed)  Results for orders placed or performed during the hospital encounter of 03/11/19 (from the past 24 hour(s))  Comprehensive metabolic panel     Status: Abnormal   Collection Time: 03/11/19  5:34 PM  Result Value Ref Range   Sodium 125 (L) 135 - 145 mmol/L   Potassium 5.1 3.5 - 5.1 mmol/L   Chloride 89 (L) 98 - 111 mmol/L   CO2 24 22 - 32 mmol/L   Glucose, Bld 862 (HH) 70 - 99 mg/dL   BUN 44 (H) 6 - 20 mg/dL   Creatinine, Ser 0.34 (H) 0.61 - 1.24 mg/dL   Calcium 9.6 8.9 - 74.2 mg/dL   Total Protein 7.8 6.5 - 8.1 g/dL   Albumin 4.1 3.5 - 5.0 g/dL   AST 41 15 - 41 U/L   ALT 33 0 - 44 U/L   Alkaline Phosphatase 99 38 - 126 U/L   Total Bilirubin 1.2 0.3 - 1.2 mg/dL   GFR calc non Af Amer 34 (L) >60 mL/min   GFR calc Af Amer 39 (L) >60 mL/min   Anion gap 12 5 - 15  CBC with Differential     Status: None   Collection Time: 03/11/19  5:34 PM  Result Value Ref Range   WBC 4.7 4.0 - 10.5 K/uL   RBC 4.46 4.22 - 5.81 MIL/uL   Hemoglobin 14.0 13.0 - 17.0 g/dL   HCT 59.5 63.8 - 75.6 %   MCV 89.5 80.0 - 100.0 fL   MCH 31.4 26.0 - 34.0 pg   MCHC 35.1 30.0 - 36.0 g/dL   RDW 43.3 29.5 - 18.8 %   Platelets 171 150 - 400 K/uL   nRBC 0.0 0.0 - 0.2 %   Neutrophils Relative % 52 %   Neutro Abs 2.5 1.7 - 7.7 K/uL   Lymphocytes Relative 33 %   Lymphs  Abs 1.5 0.7 - 4.0 K/uL   Monocytes Relative 6 %   Monocytes Absolute 0.3 0.1 - 1.0 K/uL   Eosinophils Relative 8 %   Eosinophils Absolute 0.4 0.0 - 0.5 K/uL   Basophils Relative 1 %   Basophils Absolute 0.1 0.0 - 0.1 K/uL   Immature Granulocytes 0 %   Abs Immature Granulocytes 0.01 0.00 - 0.07 K/uL  Urinalysis, Complete w Microscopic     Status: Abnormal   Collection Time: 03/11/19  8:04 PM  Result Value Ref Range   Color, Urine STRAW (A) YELLOW   APPearance CLEAR (A) CLEAR   Specific Gravity, Urine 1.026 1.005 - 1.030  pH 5.0 5.0 - 8.0   Glucose, UA >=500 (A) NEGATIVE mg/dL   Hgb urine dipstick SMALL (A) NEGATIVE   Bilirubin Urine NEGATIVE NEGATIVE   Ketones, ur 5 (A) NEGATIVE mg/dL   Protein, ur NEGATIVE NEGATIVE mg/dL   Nitrite NEGATIVE NEGATIVE   Leukocytes,Ua NEGATIVE NEGATIVE   RBC / HPF 0-5 0 - 5 RBC/hpf   WBC, UA 0-5 0 - 5 WBC/hpf   Bacteria, UA NONE SEEN NONE SEEN   Squamous Epithelial / LPF NONE SEEN 0 - 5   Mucus PRESENT   Glucose, capillary     Status: Abnormal   Collection Time: 03/11/19  8:56 PM  Result Value Ref Range   Glucose-Capillary 438 (H) 70 - 99 mg/dL   ____________________________________________ _______________________________  RADIOLOGY  I personally reviewed all radiographic images ordered to evaluate for the above acute complaints and reviewed radiology reports and findings.  These findings were personally discussed with the patient.  Please see medical record for radiology report.  ____________________________________________   PROCEDURES  Procedure(s) performed:  .Critical Care Performed by: Willy Eddy, MD Authorized by: Willy Eddy, MD   Critical care provider statement:    Critical care time (minutes):  40   Critical care was necessary to treat or prevent imminent or life-threatening deterioration of the following conditions:  Metabolic crisis and endocrine crisis   Critical care was time spent personally by me on the  following activities:  Discussions with consultants, evaluation of patient's response to treatment, examination of patient, ordering and performing treatments and interventions, ordering and review of laboratory studies, ordering and review of radiographic studies, pulse oximetry, re-evaluation of patient's condition, obtaining history from patient or surrogate and review of old charts      Critical Care performed: yes ____________________________________________   INITIAL IMPRESSION / ASSESSMENT AND PLAN / ED COURSE  Pertinent labs & imaging results that were available during my care of the patient were reviewed by me and considered in my medical decision making (see chart for details).   DDX: honc, dka, dehydration, cerebral edema, cva,   Napolean Sia is a 58 y.o. who presents to the ED with symptoms as described above.  Patient protecting his airway but does have critically elevated serum glucose with evidence of AKI.  Was given IV insulin as well as IV fluids with some improvement but still significantly elevated glucose in the 400 range.  CT imaging of the head does not show any evidence of stroke or acute abnormality.  Given his symptoms I am concerned for hyperosmolar hyperglycemic nonketotic state will add on osmolarity do feel patient will require observation in the hospital for with additional IV fluids and insulin drip.  Discussed case with hospitalist who currently agrees to that patient for further medical management.     The patient was evaluated in Emergency Department today for the symptoms described in the history of present illness. He/she was evaluated in the context of the global COVID-19 pandemic, which necessitated consideration that the patient might be at risk for infection with the SARS-CoV-2 virus that causes COVID-19. Institutional protocols and algorithms that pertain to the evaluation of patients at risk for COVID-19 are in a state of rapid change based on  information released by regulatory bodies including the CDC and federal and state organizations. These policies and algorithms were followed during the patient's care in the ED.  As part of my medical decision making, I reviewed the following data within the electronic MEDICAL RECORD NUMBER Nursing notes reviewed and incorporated,  Labs reviewed, notes from prior ED visits and [place] Controlled Substance Database   ____________________________________________   FINAL CLINICAL IMPRESSION(S) / ED DIAGNOSES  Final diagnoses:  Hyperglycemia  AKI (acute kidney injury) (HCC)      NEW MEDICATIONS STARTED DURING THIS VISIT:  New Prescriptions   No medications on file     Note:  This document was prepared using Dragon voice recognition software and may include unintentional dictation errors.    Willy Eddyobinson, Fatisha Rabalais, MD 03/11/19 2217

## 2019-03-11 NOTE — ED Notes (Signed)
Re-checked CBG 438; Cari Beth PA and Vicente Males, RN notified. Pt in recliner chair in sub-wait area awaiting rm; pt resting comfortably. No other needs voiced at this time.

## 2019-03-11 NOTE — ED Notes (Signed)
Pt came back to the front desk and states he decided he wants to stay.

## 2019-03-11 NOTE — H&P (Signed)
[facility] [place] NAME: Jeffrey Pearson    MR#:  [id number]  DATE OF BIRTH:  14-Jan-1961  DATE OF ADMISSION:  03/11/2019  PRIMARY CARE PHYSICIAN: Foye Spurling, MD   REQUESTING/REFERRING PHYSICIAN: Merlyn Lot, MD  CHIEF COMPLAINT:   Chief Complaint  Patient presents with  . Memory Loss  . Migraine  The patient was seen and examined on 03/11/2019.  HISTORY OF PRESENT ILLNESS:  Verlan Grotz  is a 58 y.o. African-American male with a known history of diabetes mellitus, HIV, TIA and depression, who presented to the emergency room with acute onset of altered mental status with confusion and lethargy and recent memory loss.  He admitted to recent headaches as well without paresthesias or focal muscle weakness.  He stated that he has not been able to find things sometimes that the right there.  He is not sure if he has taken his insulin over the last few days.  His symptoms have been going on over the last 5 days.  He admitted to polyuria and polydipsia and denies any dysuria or hematuria or flank pain.  No cough or wheezing or dyspnea.  No chest pain or palpitation.  No recent sick exposures to COVID-19.  Upon presentation to the emergency room, heart rate was 104 and otherwise vital signs were within normal.  Labs revealed venous blood gas with bicarbonate of 29.1 and CMP with blood glucose of 862 with anion gap of 12, potassium of 5.1 and sodium of 125, BUN of 44 and creatinine of 2.08, compared to 16 and 0.89 in May of this year.  CBC was unremarkable.  Urinalysis showed more than 500 glucose and was otherwise unremarkable.  Noncontrasted head CT scan revealed no acute intracranial abnormalities.  COVID-19 test is currently pending.  The patient was given 2 L bolus of IV normal saline and was placed on IV regular insulin drip after 10 units of IV NovoLog.  He will be admitted to a stepdown unit for further evaluation and management.  PAST MEDICAL HISTORY:   Past Medical History:  Diagnosis Date  . Depression   . Diabetes mellitus without complication ([place])   . HIV (human immunodeficiency virus infection) ([place])   . Hypertension   . Hyperthyroidism   . Thyroid disease     PAST SURGICAL HISTORY:   Past Surgical History:  Procedure Laterality Date  . lymph nodes  1982  . THYROIDECTOMY    . THYROIDECTOMY, PARTIAL  1980  . TONSILLECTOMY      SOCIAL HISTORY:   Social History   Tobacco Use  . Smoking status: Never Smoker  . Smokeless tobacco: Never Used  Substance Use Topics  . Alcohol use: No    FAMILY HISTORY:   Family History  Problem Relation Age of Onset  . Dementia Mother   . Prostate cancer Father     DRUG ALLERGIES:   Allergies  Allergen Reactions  . Septra [Sulfamethoxazole-Trimethoprim] Rash    REVIEW OF SYSTEMS:   ROS As per history of present illness. All pertinent systems were reviewed above. Constitutional,  HEENT, cardiovascular, respiratory, GI, GU, musculoskeletal, neuro, psychiatric, endocrine,  integumentary and hematologic systems were reviewed and are otherwise  negative/unremarkable except for positive findings mentioned above in the HPI.   MEDICATIONS AT HOME:   Prior to Admission medications   Medication Sig Start Date End Date Taking? Authorizing Provider  aspirin 325 MG tablet Take 325 mg by mouth daily. 01/03/19  Yes [provider]  atorvastatin (LIPITOR) 40 MG tablet Take 1 tablet (40 mg total) by mouth daily at 6 PM. 09/22/18   Altamese Dilling, MD  bictegravir-emtricitabine-tenofovir AF (BIKTARVY) 50-200-25 MG TABS tablet Take 1 tablet by mouth daily. 07/16/18   [provider]  citalopram (CELEXA) 40 MG tablet Take 40 mg by mouth daily. 02/01/19   [provider]  DULoxetine (CYMBALTA) 30 MG capsule Take 30 mg by mouth daily.    [provider]  insulin aspart (NOVOLOG FLEXPEN) 100 UNIT/ML FlexPen Inject 0-50 Units into the skin daily. 06/13/18    [provider]  LEVEMIR FLEXTOUCH 100 UNIT/ML Pen Inject 25 Units into the skin 2 (two) times daily. 02/07/19   [provider]  levothyroxine (SYNTHROID) 125 MCG tablet Take 125 mcg by mouth daily. 01/22/19   [provider]  lisinopril (ZESTRIL) 20 MG tablet Take 20 mg by mouth daily. 02/01/19   [provider]  traZODone (DESYREL) 100 MG tablet Take 200 mg by mouth Nightly. 06/04/18   [provider]  TRESIBA FLEXTOUCH 100 UNIT/ML SOPN FlexTouch Pen Inject 50 Units into the skin daily. 01/02/19   [provider]  TRUEPLUS PEN NEEDLES 32G X 4 MM MISC UTD TID 01/31/19   [provider]  valACYclovir (VALTREX) 500 MG tablet Take 500 mg by mouth daily. 02/07/19   [provider]      VITAL SIGNS:  Blood pressure 115/82, pulse (!) 104, temperature 98.8 F (37.1 C), temperature source Oral, resp. rate 16, height 5\' 9"  (1.753 m), weight 99.8 kg, SpO2 98 %.  PHYSICAL EXAMINATION:  Physical Exam  GENERAL:  58 y.o.-year-old African-American male patient lying in the bed with no acute distress.  EYES: Pupils equal, round, reactive to light and accommodation. No scleral icterus. Extraocular muscles intact.  HEENT: Head atraumatic, normocephalic. Oropharynx with dry mucous membrane and tongue and nasopharynx clear.  NECK:  Supple, no jugular venous distention. No thyroid enlargement, no tenderness.  LUNGS: Normal breath sounds bilaterally, no wheezing, rales,rhonchi or crepitation. No use of accessory muscles of respiration.  CARDIOVASCULAR: Regular rate and rhythm, S1, S2 normal. No murmurs, rubs, or gallops.  ABDOMEN: Soft, nondistended, nontender. Bowel sounds present. No organomegaly or mass.  EXTREMITIES: No pedal edema, cyanosis, or clubbing.  NEUROLOGIC: Cranial nerves II through XII are intact. Muscle strength 5/5 in all extremities. Sensation intact. Gait not checked.  PSYCHIATRIC: The patient is alert and oriented x 3.  Normal  affect and good eye contact. SKIN: No obvious rash, lesion, or ulcer.   LABORATORY PANEL:   CBC Recent Labs  Lab 03/11/19 1734  WBC 4.7  HGB 14.0  HCT 39.9  PLT 171   ------------------------------------------------------------------------------------------------------------------  Chemistries  Recent Labs  Lab 03/11/19 1734 03/11/19 2232  NA 125* 130*  K 5.1 4.6  CL 89* 97*  CO2 24 22  GLUCOSE 862* 422*  BUN 44* 41*  CREATININE 2.08* 1.65*  CALCIUM 9.6 9.3  AST 41  --   ALT 33  --   ALKPHOS 99  --   BILITOT 1.2  --    ------------------------------------------------------------------------------------------------------------------  Cardiac Enzymes No results for input(s): TROPONINI in the last 168 hours. ------------------------------------------------------------------------------------------------------------------  RADIOLOGY:  Ct Head Wo Contrast  Result Date: 03/11/2019 CLINICAL DATA:  Headache, normal neuro examination. Visual changes for 3 days. No injury or loss of consciousness. EXAM: CT HEAD WITHOUT CONTRAST TECHNIQUE: Contiguous axial images were obtained from the base of the skull through the vertex without intravenous contrast. COMPARISON:  09/21/2018 FINDINGS: Brain: No evidence of acute infarction, hemorrhage, hydrocephalus, extra-axial collection or mass lesion/mass effect. Signs of chronic microvascular ischemic change similar to prior exam. Vascular: No hyperdense vessel or unexpected calcification. Skull: Normal. Negative for fracture or focal lesion. Sinuses/Orbits: No acute finding. Other: None. IMPRESSION: No acute intracranial finding. Electronically Signed   By: Donzetta KohutGeoffrey  Wile M.D.   On: 03/11/2019 18:03      IMPRESSION AND PLAN:   1.  Uncontrolled type 2 diabetes mellitus with hyperosmolar nonketotic hyperglycemia.  The patient will be admitted to a stepdown unit.  He will be continued on IV insulin drip with frequent fingerstick blood  glucose measures.  He will be aggressively hydrated with IV normal saline.  We will continue his basal coverage.  Sliding  2.  Metabolic encephalopathy.  This clearly secondary to #1. Will monitor mental status with improvement of blood glucose measures.  3.  Acute kidney injury.  This is likely prerenal and hypovolemic.  Will follow renal functions with hydration.  We will hold off Zestril  4..  HIV disease.  We will continue his antiretroviral therapy.  5.  Hypertension.  His Zestril will be held off.  He will be placed on as needed IV labetalol.  6.  Dyslipidemia.  Statin therapy will be resumed.  7.  DVT prophylaxis.  Subtenons Lovenox.   All the records are reviewed and case discussed with ED provider. The plan of care was discussed in details with the patient (and family). I answered all questions. The patient agreed to proceed with the above mentioned plan. Further management will depend upon hospital course.   CODE STATUS: Full code  TOTAL TIME TAKING CARE OF THIS PATIENT: 55 minutes.    Hannah BeatJan A Arrin Pintor M.D on 03/11/2019 at 11:43 PM  [facility]   From 7 PM-7 AM, contact night-coverage [email or web address]  CC: Primary care physician; Thana AtesEron, Joseph J, MD   Note: This dictation was prepared with Dragon dictation along with smaller phrase technology. Any transcriptional errors that result from this process are unintentional.

## 2019-03-12 DIAGNOSIS — E1100 Type 2 diabetes mellitus with hyperosmolarity without nonketotic hyperglycemic-hyperosmolar coma (NKHHC): Principal | ICD-10-CM

## 2019-03-12 LAB — SARS CORONAVIRUS 2 (TAT 6-24 HRS): SARS Coronavirus 2: NEGATIVE

## 2019-03-12 LAB — BASIC METABOLIC PANEL
Anion gap: 7 (ref 5–15)
BUN: 36 mg/dL — ABNORMAL HIGH (ref 6–20)
CO2: 26 mmol/L (ref 22–32)
Calcium: 8.8 mg/dL — ABNORMAL LOW (ref 8.9–10.3)
Chloride: 103 mmol/L (ref 98–111)
Creatinine, Ser: 1.29 mg/dL — ABNORMAL HIGH (ref 0.61–1.24)
GFR calc Af Amer: >60 mL/min (ref >60)
GFR calc non Af Amer: >60 mL/min (ref >60)
Glucose, Bld: 196 mg/dL — ABNORMAL HIGH (ref 70–99)
Potassium: 4.1 mmol/L (ref 3.5–5.1)
Sodium: 136 mmol/L (ref 135–145)

## 2019-03-12 LAB — GLUCOSE, CAPILLARY
Glucose-Capillary: 257 mg/dL — ABNORMAL HIGH (ref 70–99)
Glucose-Capillary: 190 mg/dL — ABNORMAL HIGH (ref 70–99)
Glucose-Capillary: 105 mg/dL — ABNORMAL HIGH (ref 70–99)
Glucose-Capillary: 83 mg/dL (ref 70–99)
Glucose-Capillary: 45 mg/dL — ABNORMAL LOW (ref 70–99)
Glucose-Capillary: 86 mg/dL (ref 70–99)
Glucose-Capillary: 65 mg/dL — ABNORMAL LOW (ref 70–99)

## 2019-03-12 LAB — HEMOGLOBIN A1C
Hgb A1c MFr Bld: 11.2 % — ABNORMAL HIGH (ref 4.8–5.6)
Mean Plasma Glucose: 274.74 mg/dL

## 2019-03-12 MED ORDER — INSULIN ASPART 100 UNIT/ML ~~LOC~~ SOLN: 0.0000 [IU] | SUBCUTANEOUS | Status: AC

## 2019-03-12 MED ORDER — INSULIN ASPART 100 UNIT/ML ~~LOC~~ SOLN: 0.0000 [IU] | SUBCUTANEOUS | Status: DC

## 2019-03-12 NOTE — ED Notes (Signed)
Upon walking in to provide patient with breakfast tray, pt appears irate, states, "I do not want that, there is nothing you can say to make me stay here, I need you to take these out or I will take them out myself."  This RN attempted to explain the concern for his low blood sugar and asked for me to let the , pt becomes louder with the more that I say.  Pt states, "I dont care, I am not staying here."  Pt A/Ox4, ambulatory independently without difficulty.    Energy manager made aware of situation.

## 2019-03-12 NOTE — ED Notes (Signed)
CBG 65, Ashley,RN made aware.

## 2019-03-12 NOTE — ED Notes (Signed)
Pt upset and complaining about care received and wanted to speak with Charge RN- this RN went into pts room and pt expressed that he had a bad experience from triage on. States that he feels like he was talked to condescendingly and that he asked earlier about getting food tray- pt was offered peanut butter and crackers but states that he wanted food- food trays had not been delivered at this time. Pt states that the time the food trays were passed out he didn't feel like eating and that he wishes to leave the ED. While talking with pt his father called and stated that he was outside. Pt voices that he will leave here and eat and make sure to make f/u apt with PCP. AMA form signed. Apologies given by this RN.

## 2019-03-12 NOTE — ED Notes (Signed)
CBG 86, Ashley,RN made aware.

## 2019-03-12 NOTE — Progress Notes (Addendum)
Inpatient Diabetes Program Recommendations  AACE/ADA: New Consensus Statement on Inpatient Glycemic Control (2015)  Target Ranges:  Prepandial:   less than 140 mg/dL      Peak postprandial:   less than 180 mg/dL (1-2 hours)      Critically ill patients:  140 - 180 mg/dL   Results for Jeffrey Pearson, Jeffrey Pearson (MRN [id number]) as of 03/12/2019 08:54  Ref. Range 03/11/2019 17:34  Glucose Latest Ref Range: 70 - 99 mg/dL [facility])   Results for Jeffrey Pearson, Jeffrey Pearson (MRN [id number]) as of 03/12/2019 08:54  Ref. Range 03/11/2019 20:56 03/11/2019 22:39 03/11/2019 23:51 03/12/2019 01:29 03/12/2019 02:35 03/12/2019 03:53 03/12/2019 04:48 03/12/2019 06:58 03/12/2019 07:42  Glucose-Capillary Latest Ref Range: 70 - 99 mg/dL 438 (H) 414 (H)  IV Insulin Drip 306 (H)  IV Insulin Drip 257 (H)  IV Insulin Drip +  25 units LEVEMIR given at 1:30am 190 (H)  IV Insulin Drip 105 (H)  IV Insulin Drip OFF 83 45 (L) 86   Results for Jeffrey Pearson, Jeffrey Pearson (MRN [id number]) as of 03/12/2019 08:54  Ref. Range 08/24/2018 05:33 09/21/2018 06:11 03/11/2019 22:32  Hemoglobin A1C Latest Ref Range: 4.8 - 5.6 % 13.1 (H) 11.8 (H) 11.2 (H)  (274 mg/dl)    Admit with: HHNK/ Uncontrolled Diabetes  History: DM, HIV  Home DM Meds: Levemir 25 units BID       Novolog 20 units TID  Current Orders: Levemir 25 units BID      Novolog Resistant Correction Scale/ SSI (0-20 units) Q4 hours   PCP: Dr. Rudi Coco  ENDO: Dr. Ernestine Conrad record review, pt told his PCP Dr. Rudi Coco that he wanted to see another ENDO Dr. Joanna Hews due to personal reasons.   Patient states He is not sure if he has taken his insulin over the last few days.  Per record review, pt had an appt with [facility] on 01/17/2019, however, I do not see that pt kept his appointment.  Note Levemir started at 1am today.  Novolog SSi to start this AM as well.     --Will follow patient during hospitalization--  Wyn Quaker RN, MSN, CDE Diabetes  Coordinator Inpatient Glycemic Control Team Team Pager: [phone] (8a-5p)

## 2019-03-12 NOTE — ED Notes (Signed)
Called pharmacy to send synthroid

## 2019-03-12 NOTE — ED Notes (Signed)
Breakfast tray provided; pt declines.

## 2019-03-12 NOTE — ED Notes (Addendum)
Pt very upset wanting to leave AMA. Pt stated "I want to leave. Either yall can take these IVs out or I will take them out myself. There is nothing yall can do to make me stay here." Ashely,RN at bedside.

## 2019-03-12 NOTE — ED Notes (Signed)
Charge RN to bedside at this time.

## 2019-03-12 NOTE — Progress Notes (Signed)
[facility] Discharge Summary   Patient: Jeffrey Pearson JSE:[id number]   PCP: Foye Spurling, MD DOB: 04-Jul-1960   Date of admission: 03/11/2019   Date of discharge: 03/12/2019    Discharge Diagnoses:  Hyperosmolar nonketotic hyperglycemia     Discharge Condition: Unknown  History of present illness:  Jeffrey Pearson is a 58 y.o. M with HIV on HAART, DM, hypothyroidism, hx Hep C and B s/p TDF/FTC and TIA who presented with confusion, memory loss, polyuria/dipsia and sluggishness.  In the ER, CBG >800, AG normal.  Cr 2.08 (from baseline 0.9) and CT head unremarkable.  Started on IV fluids and insulin.          Hospital Course:  Hyperosmolar nonketotic hyperglycemia Diabetes Patient was started on insulin drip overnight, glucoses improved.  Transitioned to subcutaneous insulin.  In the morning, the patient appeared alert and purposeful.  He requested to leave.  I was notified that the patient was planning to leave the hospital without being discharged while preparing to attend to a higher acuity patient.  Before I was able to complete that assessment and evaluate Jeffrey Pearson, he left the hospital.  Nursing report that they made clear to the patient (as nursing and I agreed they should) that he was leaving against medical advice, had not had complete medical work up.  He understood and reportedly stated he would follow up with his PCP.    Acute metabolic encephalopathy  Acute kidney injury Resolved  HIV  Hypertension Cerebrovascular disease secondary prevention  Depression  Hypothyroidism        patient left AMA   The results of significant diagnostics from this hospitalization (including imaging, microbiology, ancillary and laboratory) are listed below for reference.    Significant Diagnostic Studies: Ct Head Wo Contrast  Result Date: 03/11/2019 CLINICAL DATA:  Headache, normal neuro examination. Visual changes for 3 days. No injury or loss of  consciousness. EXAM: CT HEAD WITHOUT CONTRAST TECHNIQUE: Contiguous axial images were obtained from the base of the skull through the vertex without intravenous contrast. COMPARISON:  09/21/2018 FINDINGS: Brain: No evidence of acute infarction, hemorrhage, hydrocephalus, extra-axial collection or mass lesion/mass effect. Signs of chronic microvascular ischemic change similar to prior exam. Vascular: No hyperdense vessel or unexpected calcification. Skull: Normal. Negative for fracture or focal lesion. Sinuses/Orbits: No acute finding. Other: None. IMPRESSION: No acute intracranial finding. Electronically Signed   By: Zetta Bills M.D.   On: 03/11/2019 18:03    Microbiology: Recent Results (from the past 240 hour(s))  SARS CORONAVIRUS 2 (TAT 6-24 HRS) Nasopharyngeal Nasopharyngeal Swab     Status: None   Collection Time: 03/11/19 10:32 PM   Specimen: Nasopharyngeal Swab  Result Value Ref Range Status   SARS Coronavirus 2 NEGATIVE NEGATIVE Final    Comment: (NOTE) SARS-CoV-2 target nucleic acids are NOT DETECTED. The SARS-CoV-2 RNA is generally detectable in upper and lower respiratory specimens during the acute phase of infection. Negative results do not preclude SARS-CoV-2 infection, do not rule out co-infections with other pathogens, and should not be used as the sole basis for treatment or other patient management decisions. Negative results must be combined with clinical observations, patient history, and epidemiological information. The expected result is Negative. Fact Sheet for Patients: SugarRoll.be Fact Sheet for Healthcare Providers: https://www.woods-mathews.com/ This test is not yet approved or cleared by the [place] FDA and  has been authorized for detection and/or diagnosis of SARS-CoV-2 by FDA under an Emergency Use Authorization (EUA). This EUA will remain  in effect (  meaning this test can be used) for the duration of the  COVID-19 declaration under Section 56 4(b)(1) of the Act, 21 U.S.C. section 360bbb-3(b)(1), unless the authorization is terminated or revoked sooner. Performed at [facility], [address], [place], [place] [address]      Labs: CBC: Recent Labs  Lab 03/11/19 1734  WBC 4.7  NEUTROABS 2.5  HGB 14.0  HCT 39.9  MCV 89.5  PLT 171   Basic Metabolic Panel: Recent Labs  Lab 03/11/19 1734 03/11/19 2232 03/12/19 0247  NA 125* 130* 136  K 5.1 4.6 4.1  CL 89* 97* 103  CO2 24 22 26   GLUCOSE 862* 422* 196*  BUN 44* 41* 36*  CREATININE 2.08* 1.65* 1.29*  CALCIUM 9.6 9.3 8.8*   Liver Function Tests: Recent Labs  Lab 03/11/19 1734  AST 41  ALT 33  ALKPHOS 99  BILITOT 1.2  PROT 7.8  ALBUMIN 4.1   No results for input(s): LIPASE, AMYLASE in the last 168 hours. No results for input(s): AMMONIA in the last 168 hours. Cardiac Enzymes: No results for input(s): CKTOTAL, CKMB, CKMBINDEX, TROPONINI in the last 168 hours. BNP (last 3 results) No results for input(s): BNP in the last 8760 hours. CBG: Recent Labs  Lab 03/12/19 0353 03/12/19 0448 03/12/19 0658 03/12/19 0742 03/12/19 0922  GLUCAP 105* 83 45* 86 65*        Signed:  Christopher P Danford  [facility] 03/12/2019, 8:50 PM

## 2019-07-19 ENCOUNTER — Ambulatory Visit: Payer: Self-pay | Attending: Internal Medicine

## 2019-07-20 ENCOUNTER — Ambulatory Visit: Payer: Self-pay | Attending: Internal Medicine

## 2019-07-20 DIAGNOSIS — Z23 Encounter for immunization: Secondary | ICD-10-CM

## 2019-07-20 NOTE — Progress Notes (Signed)
   Covid-19 Vaccination Clinic  Name:  Jeffrey Pearson    MRN: [id number] DOB: December 24, 1960  07/20/2019  Mr. Jeffrey Pearson was observed post Covid-19 immunization for 15 minutes without incident. He was provided with Vaccine Information Sheet and instruction to access the V-Safe system.   Mr. Jeffrey Pearson was instructed to call 911 with any severe reactions post vaccine: Marland Kitchen Difficulty breathing  . Swelling of face and throat  . A fast heartbeat  . A bad rash all over body  . Dizziness and weakness   Immunizations Administered    Name Date Dose VIS Date Route   Pfizer COVID-19 Vaccine 07/20/2019  9:49 AM 0.3 mL 04/12/2019 Intramuscular   Manufacturer: [employer], [employer]   Lot: [id number]   NDC: [address]-4

## 2019-08-03 ENCOUNTER — Emergency Department
Admission: EM | Admit: 2019-08-03 | Discharge: 2019-08-03 | Disposition: A | Discharge status: Home / Self Care | Payer: Self-pay | Attending: Emergency Medicine | Admitting: Emergency Medicine

## 2019-08-03 ENCOUNTER — Other Ambulatory Visit: Payer: Self-pay

## 2019-08-03 ENCOUNTER — Encounter: Payer: Self-pay | Admitting: Emergency Medicine

## 2019-08-03 DIAGNOSIS — M65052 Abscess of tendon sheath, left thigh: Secondary | ICD-10-CM | POA: Insufficient documentation

## 2019-08-03 DIAGNOSIS — Z79899 Other long term (current) drug therapy: Secondary | ICD-10-CM | POA: Insufficient documentation

## 2019-08-03 DIAGNOSIS — B20 Human immunodeficiency virus [HIV] disease: Secondary | ICD-10-CM | POA: Insufficient documentation

## 2019-08-03 DIAGNOSIS — Z7982 Long term (current) use of aspirin: Secondary | ICD-10-CM | POA: Insufficient documentation

## 2019-08-03 DIAGNOSIS — Z8673 Personal history of transient ischemic attack (TIA), and cerebral infarction without residual deficits: Secondary | ICD-10-CM | POA: Insufficient documentation

## 2019-08-03 DIAGNOSIS — E119 Type 2 diabetes mellitus without complications: Secondary | ICD-10-CM | POA: Insufficient documentation

## 2019-08-03 DIAGNOSIS — I10 Essential (primary) hypertension: Secondary | ICD-10-CM | POA: Insufficient documentation

## 2019-08-03 DIAGNOSIS — Z794 Long term (current) use of insulin: Secondary | ICD-10-CM | POA: Insufficient documentation

## 2019-08-03 DIAGNOSIS — L0291 Cutaneous abscess, unspecified: Secondary | ICD-10-CM

## 2019-08-03 MED ORDER — CLINDAMYCIN HCL 150 MG PO CAPS: 300.0000 mg | ORAL_CAPSULE | Freq: Three times a day (TID) | ORAL | 0 refills | Status: DC

## 2019-08-03 MED ORDER — HYDROCODONE-ACETAMINOPHEN 5-325 MG PO TABS: 1.0000 | ORAL_TABLET | Freq: Four times a day (QID) | ORAL | 0 refills | Status: DC | PRN

## 2019-08-03 NOTE — Discharge Instructions (Signed)
Follow-up with your regular doctor if not improving in 2 days.  Return emergency department for worsening take the medication as prescribed.  Once the area is healing we can start soaking in a tub of warm water with 1/4 cup of bleach weekly.  This will help reduce the number of abscesses that you get

## 2019-08-03 NOTE — ED Provider Notes (Signed)
[facility] Emergency Department Provider Note  ____________________________________________   First MD Initiated Contact with Patient 08/03/19 1257     (approximate)  I have reviewed the triage vital signs and the nursing notes.   HISTORY  Chief Complaint Recurrent Skin Infections    HPI Jeffrey Pearson is a 59 y.o. male presents emergency department complaining of abscess to the left and high for 1 to 2 months.  Patient states is been uncomfortable but it drained this morning.  He states he keeps becoming infected-/rub together.  Denies any fever or chills.  No other problems at this time.  However he is insulin-dependent and HIV positive    Past Medical History:  Diagnosis Date  . Depression   . Diabetes mellitus without complication (HCC)   . HIV (human immunodeficiency virus infection) (HCC)   . Hypertension   . Hyperthyroidism   . Thyroid disease     Patient Active Problem List   Diagnosis Date Noted  . Type 2 diabetes mellitus with hyperosmolar nonketotic hyperglycemia (HCC) 03/11/2019  . TIA (transient ischemic attack) 09/21/2018  . Acute encephalopathy   . 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 lymph nodes  1982  . THYROIDECTOMY    . THYROIDECTOMY, PARTIAL  1980  . TONSILLECTOMY      Prior to Admission medications   Medication Sig Start Date End Date Taking? Authorizing Provider  aspirin 325 MG tablet Take 325 mg by mouth daily. 01/03/19   [provider]  atorvastatin (LIPITOR) 40 MG tablet Take 1 tablet (40 mg total) by mouth daily at 6 PM. 09/22/18   Altamese Dilling, MD  bictegravir-emtricitabine-tenofovir AF (BIKTARVY) 50-200-25 MG TABS tablet Take 1 tablet by mouth daily. 07/16/18   [provider]  citalopram (CELEXA) 40 MG tablet Take 40 mg by mouth daily. 02/01/19   [provider]  clindamycin (CLEOCIN) 150 MG capsule  Take 2 capsules (300 mg total) by mouth 3 (three) times daily. 08/03/19   Dishon Kehoe, Roselyn Bering, PA-C  HYDROcodone-acetaminophen (NORCO/VICODIN) 5-325 MG tablet Take 1 tablet by mouth every 6 (six) hours as needed for moderate pain. 08/03/19   Elija Mccamish, Roselyn Bering, PA-C  insulin aspart (NOVOLOG FLEXPEN) 100 UNIT/ML FlexPen Inject 20 Units into the skin 3 (three) times daily before meals.     [provider]  LEVEMIR FLEXTOUCH 100 UNIT/ML Pen Inject 25 Units into the skin 2 (two) times daily. 02/07/19   [provider]  levothyroxine (SYNTHROID) 125 MCG tablet Take 125 mcg by mouth daily. 01/22/19   [provider]  lisinopril (ZESTRIL) 20 MG tablet Take 20 mg by mouth daily. 02/01/19   [provider]  traZODone (DESYREL) 100 MG tablet Take 200 mg by mouth Nightly. 06/04/18   [provider]  valACYclovir (VALTREX) 500 MG tablet Take 500 mg by mouth daily. 02/07/19   [provider]    Allergies Septra [sulfamethoxazole-trimethoprim]  Family History  Problem Relation Age of Onset  . Dementia Mother   . Prostate cancer Father     Social History Social History   Tobacco Use  . Smoking status: Never Smoker  . Smokeless tobacco: Never Used  Substance Use Topics  . Alcohol use: No  . Drug use: Never    Review of Systems  Constitutional: No fever/chills Eyes: No visual changes. ENT: No sore throat. Respiratory: Denies cough Cardiovascular: Denies chest pain Gastrointestinal: Denies abdominal pain Genitourinary: Negative for dysuria.  Musculoskeletal: Negative for back pain. Skin: Negative for rash.  Positive for abscess left inner thigh Psychiatric: no mood changes,     ____________________________________________   PHYSICAL EXAM:  VITAL SIGNS: ED Triage Vitals  Enc Vitals Group     BP 08/03/19 1251 (!) 186/76     Pulse Rate 08/03/19 1251 80     Resp 08/03/19 1251 18     Temp 08/03/19 1251 98.2 F (36.8 C)     Temp src --      SpO2  08/03/19 1251 100 %     Weight 08/03/19 1252 219 lb (99.3 kg)     Height 08/03/19 1252 5\' 9"  (1.753 m)     Head Circumference --      Peak Flow --      Pain Score 08/03/19 1251 8     Pain Loc --      Pain Edu? --      Excl. in GC? --     Constitutional: Alert and oriented. Well appearing and in no acute distress. Eyes: Conjunctivae are normal.  Head: Atraumatic. Nose: No congestion/rhinnorhea. Mouth/Throat: Mucous membranes are moist.   Neck:  supple no lymphadenopathy noted Cardiovascular: Normal rate, regular rhythm. Heart sounds are normal Respiratory: Normal respiratory effort.  No retractions, lungs c t a GU: deferred Musculoskeletal: FROM all extremities, warm and well perfused Neurologic:  Normal speech and language.  Skin:  Skin is warm, dry , and already draining abscess is noted on the left inner thigh, no active drainage is noted, wound is open.  Area is slightly tender Psychiatric: Mood and affect are normal. Speech and behavior are normal.  ____________________________________________   LABS (all labs ordered are listed, but only abnormal results are displayed)  Labs Reviewed - No data to display ____________________________________________   ____________________________________________  RADIOLOGY    ____________________________________________   PROCEDURES  Procedure(s) performed: No  Procedures    ____________________________________________   INITIAL IMPRESSION / ASSESSMENT AND PLAN / ED COURSE  Pertinent labs & imaging results that were available during my care of the patient were reviewed by me and considered in my medical decision making (see chart for details).   Patient is 59 year old male presents emergency department with concerns of an abscess to the left inner thigh.  See HPI  Patient appears well.  He is afebrile.  Left inner thigh has already draining abscess.  No tenderness around the area.  Is strictly on the thigh does not  extend into the scrotum or buttocks.  Explained findings to patient.  Is placed on clindamycin.  Vicodin for pain as needed.  Follow his regular doctor if not improving to 3 days.  Return emergency department worsening.  Explained to him that taking a weekly bleach bath will help kill staff on his skin and prevent abscesses.  He states he understands and will comply with our instructions.  And he will return if worsening.    Jeffrey Pearson was evaluated in Emergency Department on 08/03/2019 for the symptoms described in the history of present illness. He was evaluated in the context of the global COVID-19 pandemic, which necessitated consideration that the patient might be at risk for infection with the SARS-CoV-2 virus that causes COVID-19. Institutional protocols and algorithms that pertain to the evaluation of patients at risk for COVID-19 are in a state of rapid change based on information released by regulatory bodies including the CDC and federal and state organizations. These policies and algorithms were followed during the patient's care in the  ED.   As part of my medical decision making, I reviewed the following data within the [place] notes reviewed and incorporated, Old chart reviewed, Notes from prior ED visits and  Controlled Substance Database  ____________________________________________   FINAL CLINICAL IMPRESSION(S) / ED DIAGNOSES  Final diagnoses:  Abscess      NEW MEDICATIONS STARTED DURING THIS VISIT:  New Prescriptions   CLINDAMYCIN (CLEOCIN) 150 MG CAPSULE    Take 2 capsules (300 mg total) by mouth 3 (three) times daily.   HYDROCODONE-ACETAMINOPHEN (NORCO/VICODIN) 5-325 MG TABLET    Take 1 tablet by mouth every 6 (six) hours as needed for moderate pain.     Note:  This document was prepared using Dragon voice recognition software and may include unintentional dictation errors.    Versie Starks, PA-C 08/03/19 1351    Vanessa Tintah,  MD 08/04/19 [phone]

## 2019-08-03 NOTE — ED Notes (Signed)
Left thigh has gauze applied and wrapped with bandage gauze.

## 2019-08-03 NOTE — ED Triage Notes (Signed)
Pt presents with c/o boil on inner thigh x 1 week

## 2019-08-14 ENCOUNTER — Ambulatory Visit: Payer: Self-pay | Attending: Internal Medicine

## 2019-08-14 DIAGNOSIS — Z23 Encounter for immunization: Secondary | ICD-10-CM

## 2019-08-14 NOTE — Progress Notes (Signed)
   Covid-19 Vaccination Clinic  Name:  Nazier Neyhart    MRN: [id number] DOB: [birth year]  08/14/2019  Mr. Metoyer was observed post Covid-19 immunization for 15 minutes without incident. He was provided with Vaccine Information Sheet and instruction to access the V-Safe system.   Mr. Schlarb was instructed to call 911 with any severe reactions post vaccine: Marland Kitchen Difficulty breathing  . Swelling of face and throat  . A fast heartbeat  . A bad rash all over body  . Dizziness and weakness   Immunizations Administered    Name Date Dose VIS Date Route   Pfizer COVID-19 Vaccine 08/14/2019  1:56 PM 0.3 mL 04/12/2019 Intramuscular   Manufacturer: [employer], [employer]   Lot: [id number]   NDC: [address]-7

## 2021-02-09 IMAGING — CT CT CHEST W/ CM
2 of 3 series · 14 of 36 positions shown, 17 images · IV contrast (omnipaque)
Comparison: Chest x-ray July 08, 2018

CLINICAL DATA: Rounded opacity in the right upper lobe seen on
chest x-ray from earlier today could represent neoplasm versus
pneumonia. CT scan was recommended.

EXAM:
CT CHEST WITH CONTRAST
TECHNIQUE: Multidetector CT imaging of the chest was performed during
intravenous contrast administration.
CONTRAST:  75mL OMNIPAQUE IOHEXOL 300 MG/ML  SOLN

[Series 2: axial st · axial · 0.66mm/px · z∈[-709,-495]mm · 11 of 127 slices shown, 14 images]
[im 10/127  mediastinal]
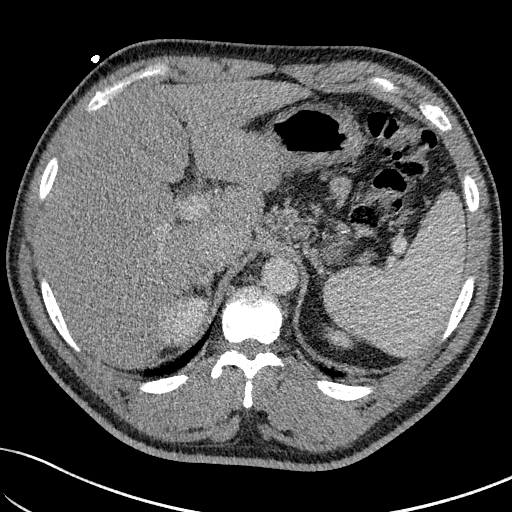
[im 10/127  lung]
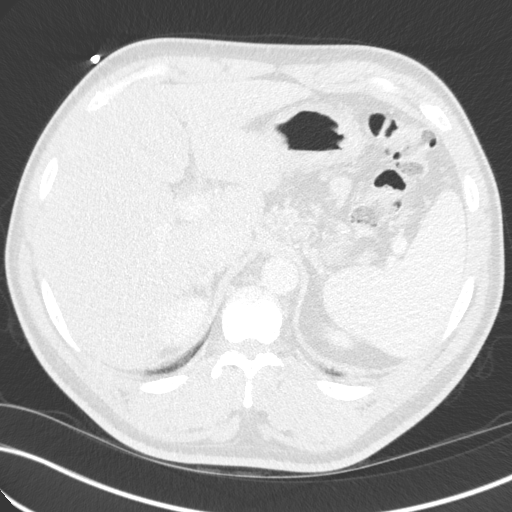
[im 19/127  lung]
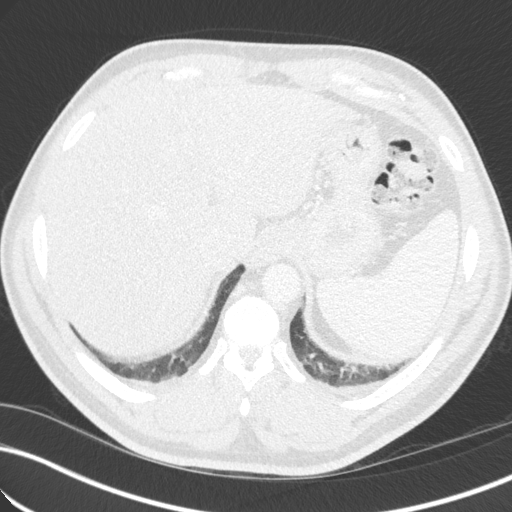
[im 29/127  lung]
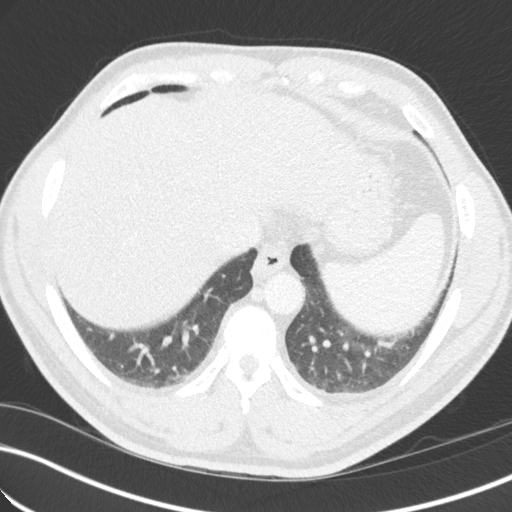
[im 43/127  lung]
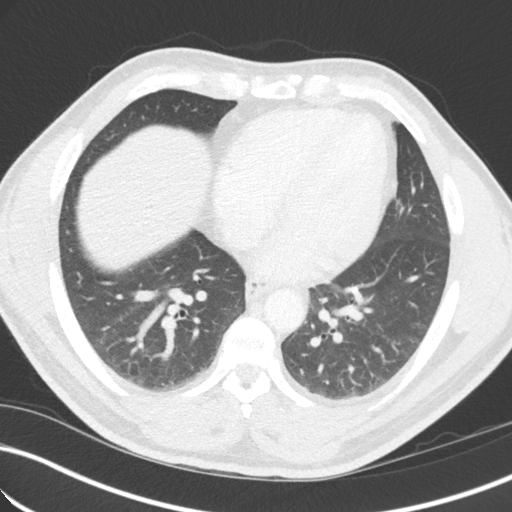
[im 52/127  mediastinal]
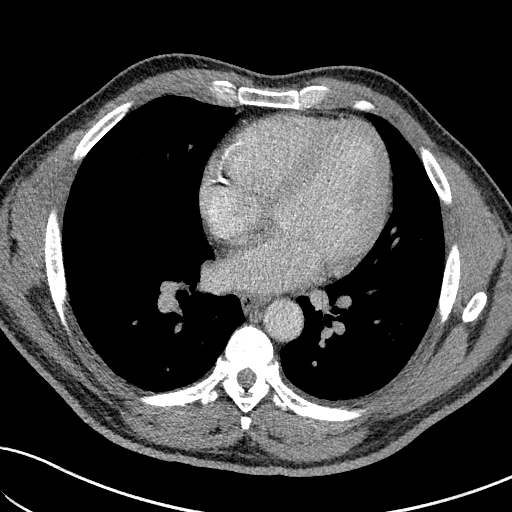
[im 52/127  lung]
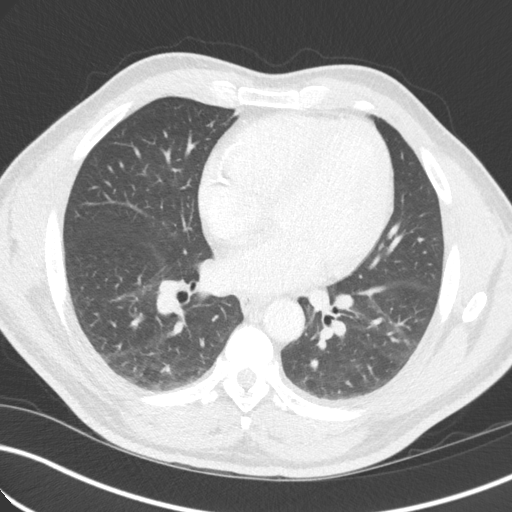
[im 66/127  lung]
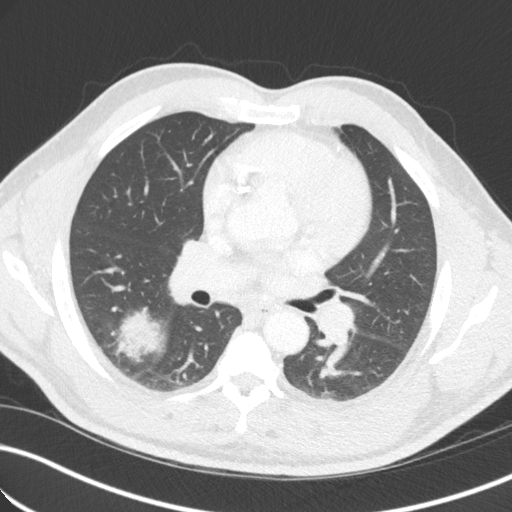
[im 75/127  lung]
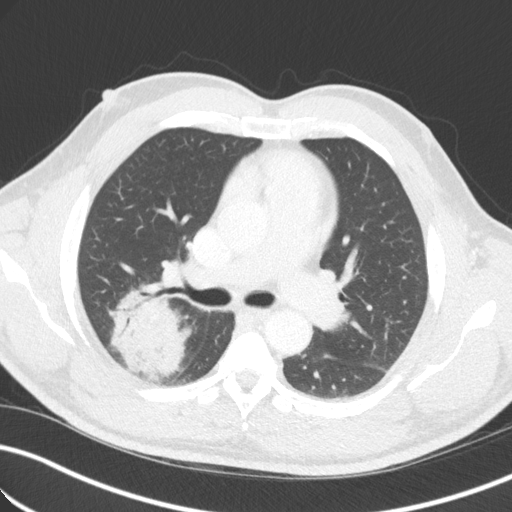
[im 85/127  lung]
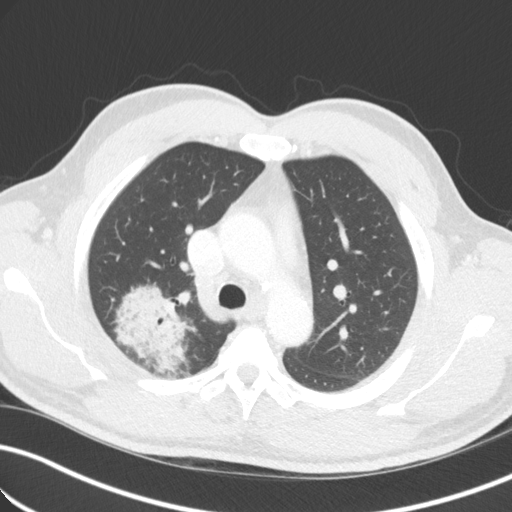
[im 99/127  mediastinal]
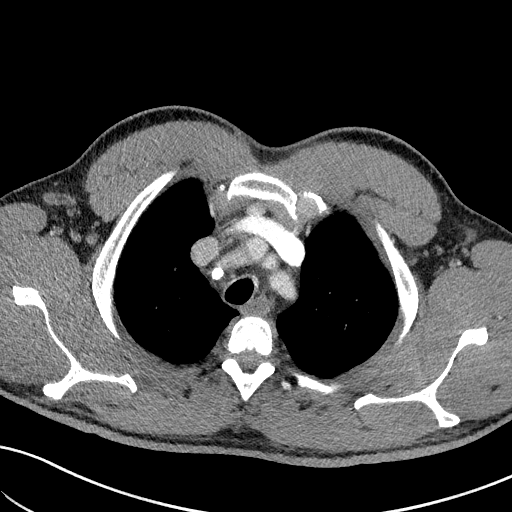
[im 99/127  lung]
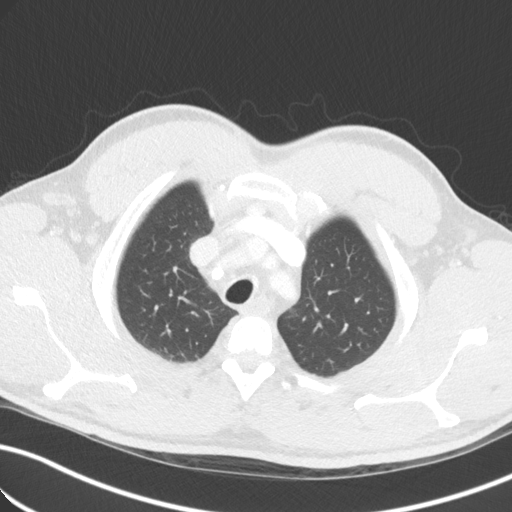
[im 108/127  lung]
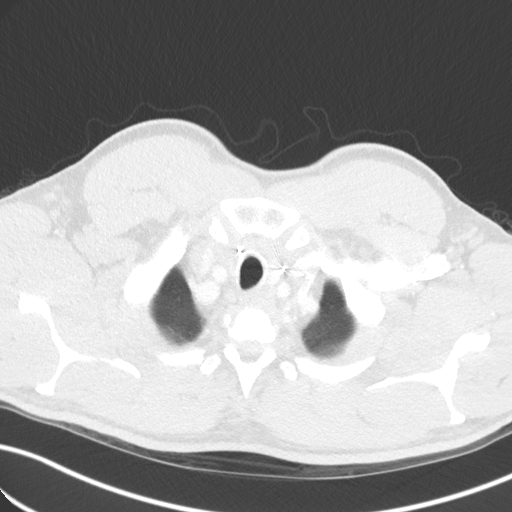
[im 117/127  lung]
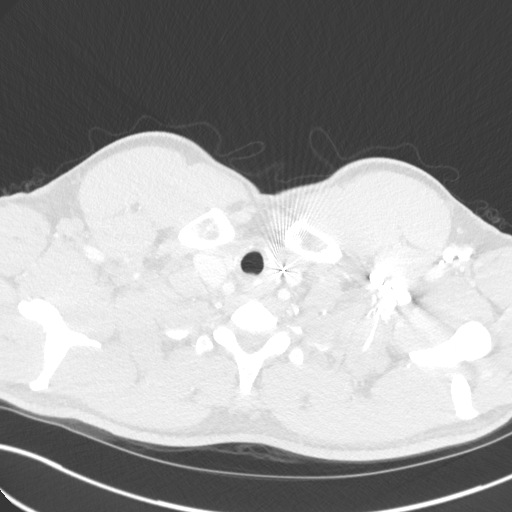

[Series 5: coronal · coronal · 0.51mm/px · 3 of 143 slices shown]
[im 29/143  lung]
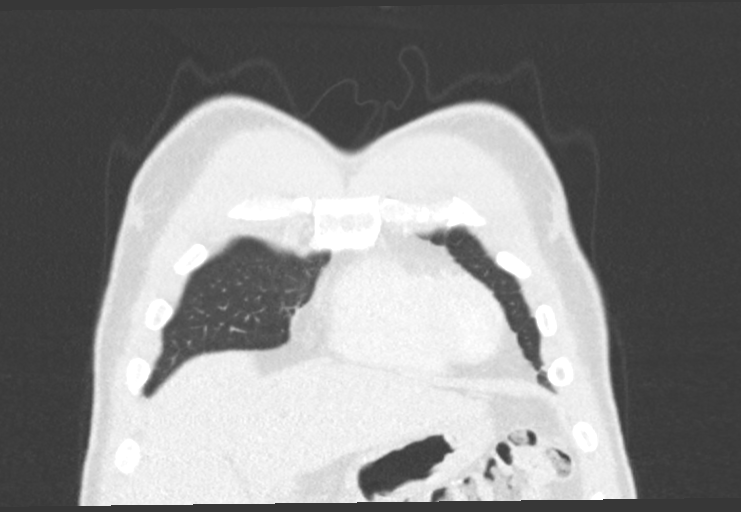
[im 57/143  lung]
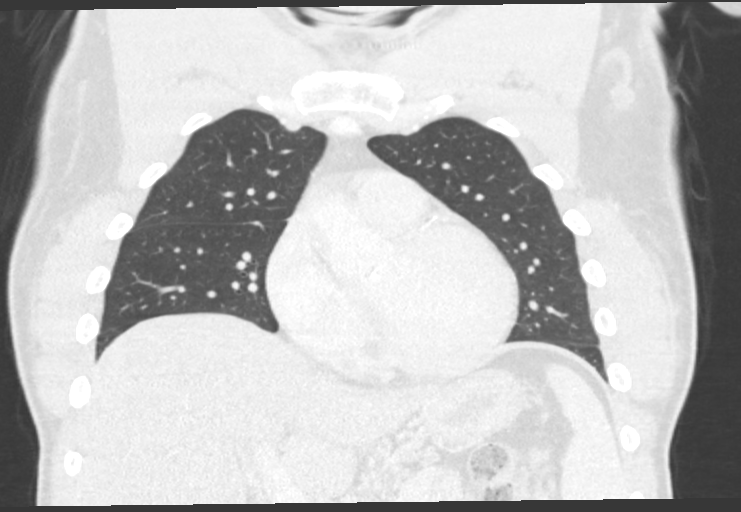
[im 86/143  lung]
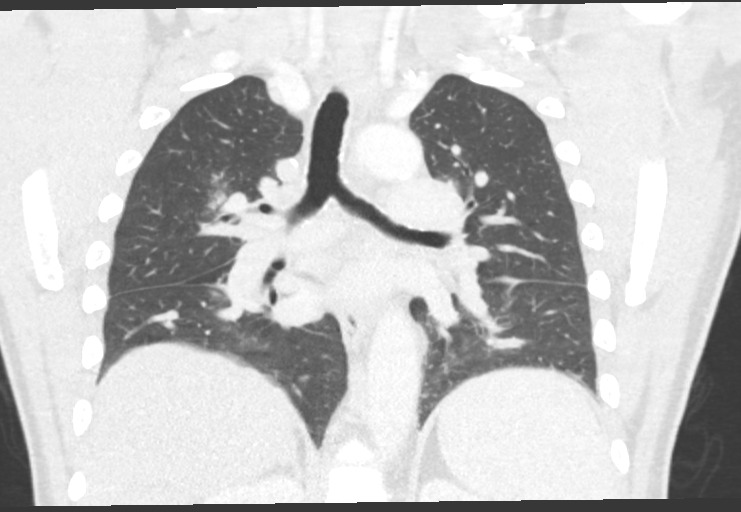

[14 of 36 positions shown; findings below may reference images not displayed]

FINDINGS: Cardiovascular: Atherosclerotic changes are seen in the
nonaneurysmal aorta. Central pulmonary arteries are normal. Coronary
artery calcifications involve the right and left coronary arteries.
The heart size borderline.

Mediastinum/Nodes: There is a high attenuation mass measuring 15 x 9
mm in the anterior mediastinum on series 2, image 31. No other
suspicious masses in the mediastinum. No effusions. Surgical clips
in the thyroid bed consistent with history of thyroidectomy. There
does appear to be some residual thyroid tissue. Mildly prominent
axillary nodes may be reactive.

Lungs/Pleura: Central airways are normal. No pneumothorax. There is
rounded opacity in the posterior right upper lobe with air
bronchograms. The opacity measures 6.4 by 5.6 by 5.7 cm. No other
nodules, masses, or focal infiltrates identified.

Upper Abdomen: No acute abnormality.

Musculoskeletal: Anterior wedging of 2 or 3 midthoracic vertebral
bodies. Mild degenerative changes.
IMPRESSION: 1. Rounded opacity in the right upper lobe favored to represent
pneumonia. Neoplasm considered less likely but not completely
excluded. Recommend treatment with antibiotics and a follow-up chest
CT in 6-8 weeks to ensure resolution.
2. 15 x 9 mm high attenuation mass in the anterior mediastinum may
represent a hyperenhancing lymph node. Thyroid tissue is a
possibility as is a nodule of thymic origin. Recommend attention on
follow-up.
3. Atherosclerotic changes in the nonaneurysmal aorta.
4. Coronary artery calcifications.
5. Mildly prominent bilateral axillary nodes, likely reactive.
Recommend attention on follow-up.

Aortic Atherosclerosis (8FL0O-U27.7).

## 2021-10-15 ENCOUNTER — Inpatient Hospital Stay
Admission: EM | Admit: 2021-10-15 | Discharge: 2021-10-18 | DRG: 637 | Disposition: A | Discharge status: Home Health | Payer: BLUE CROSS/BLUE SHIELD | Attending: Internal Medicine | Admitting: Internal Medicine

## 2021-10-15 ENCOUNTER — Other Ambulatory Visit: Payer: Self-pay

## 2021-10-15 ENCOUNTER — Inpatient Hospital Stay: Payer: BLUE CROSS/BLUE SHIELD

## 2021-10-15 ENCOUNTER — Emergency Department: Payer: BLUE CROSS/BLUE SHIELD

## 2021-10-15 DIAGNOSIS — G9340 Encephalopathy, unspecified: Secondary | ICD-10-CM | POA: Diagnosis present

## 2021-10-15 DIAGNOSIS — N529 Male erectile dysfunction, unspecified: Secondary | ICD-10-CM | POA: Diagnosis present

## 2021-10-15 DIAGNOSIS — I110 Hypertensive heart disease with heart failure: Secondary | ICD-10-CM | POA: Diagnosis present

## 2021-10-15 DIAGNOSIS — Z21 Asymptomatic human immunodeficiency virus [HIV] infection status: Secondary | ICD-10-CM | POA: Diagnosis present

## 2021-10-15 DIAGNOSIS — R748 Abnormal levels of other serum enzymes: Secondary | ICD-10-CM | POA: Diagnosis present

## 2021-10-15 DIAGNOSIS — E039 Hypothyroidism, unspecified: Secondary | ICD-10-CM | POA: Diagnosis present

## 2021-10-15 DIAGNOSIS — E1165 Type 2 diabetes mellitus with hyperglycemia: Secondary | ICD-10-CM

## 2021-10-15 DIAGNOSIS — E785 Hyperlipidemia, unspecified: Secondary | ICD-10-CM | POA: Diagnosis present

## 2021-10-15 DIAGNOSIS — G20 Parkinson's disease: Secondary | ICD-10-CM | POA: Diagnosis present

## 2021-10-15 DIAGNOSIS — Z818 Family history of other mental and behavioral disorders: Secondary | ICD-10-CM

## 2021-10-15 DIAGNOSIS — D696 Thrombocytopenia, unspecified: Secondary | ICD-10-CM | POA: Diagnosis present

## 2021-10-15 DIAGNOSIS — F32A Depression, unspecified: Secondary | ICD-10-CM | POA: Diagnosis present

## 2021-10-15 DIAGNOSIS — Z794 Long term (current) use of insulin: Secondary | ICD-10-CM | POA: Diagnosis not present

## 2021-10-15 DIAGNOSIS — Z20822 Contact with and (suspected) exposure to covid-19: Secondary | ICD-10-CM | POA: Diagnosis present

## 2021-10-15 DIAGNOSIS — E875 Hyperkalemia: Secondary | ICD-10-CM | POA: Diagnosis present

## 2021-10-15 DIAGNOSIS — E871 Hypo-osmolality and hyponatremia: Secondary | ICD-10-CM | POA: Diagnosis present

## 2021-10-15 DIAGNOSIS — T383X6A Underdosing of insulin and oral hypoglycemic [antidiabetic] drugs, initial encounter: Secondary | ICD-10-CM | POA: Diagnosis present

## 2021-10-15 DIAGNOSIS — J9811 Atelectasis: Secondary | ICD-10-CM | POA: Diagnosis present

## 2021-10-15 DIAGNOSIS — Z6834 Body mass index (BMI) 34.0-34.9, adult: Secondary | ICD-10-CM

## 2021-10-15 DIAGNOSIS — E669 Obesity, unspecified: Secondary | ICD-10-CM | POA: Diagnosis present

## 2021-10-15 DIAGNOSIS — I5032 Chronic diastolic (congestive) heart failure: Secondary | ICD-10-CM | POA: Diagnosis present

## 2021-10-15 DIAGNOSIS — B20 Human immunodeficiency virus [HIV] disease: Secondary | ICD-10-CM | POA: Diagnosis present

## 2021-10-15 DIAGNOSIS — Z79890 Hormone replacement therapy (postmenopausal): Secondary | ICD-10-CM

## 2021-10-15 DIAGNOSIS — E870 Hyperosmolality and hypernatremia: Secondary | ICD-10-CM | POA: Diagnosis not present

## 2021-10-15 DIAGNOSIS — E1110 Type 2 diabetes mellitus with ketoacidosis without coma: Secondary | ICD-10-CM | POA: Diagnosis present

## 2021-10-15 DIAGNOSIS — B1910 Unspecified viral hepatitis B without hepatic coma: Secondary | ICD-10-CM | POA: Diagnosis present

## 2021-10-15 DIAGNOSIS — D649 Anemia, unspecified: Secondary | ICD-10-CM | POA: Diagnosis present

## 2021-10-15 DIAGNOSIS — G9341 Metabolic encephalopathy: Secondary | ICD-10-CM | POA: Diagnosis present

## 2021-10-15 DIAGNOSIS — I4510 Unspecified right bundle-branch block: Secondary | ICD-10-CM | POA: Diagnosis present

## 2021-10-15 DIAGNOSIS — N179 Acute kidney failure, unspecified: Secondary | ICD-10-CM | POA: Diagnosis present

## 2021-10-15 DIAGNOSIS — Z91148 Patient's other noncompliance with medication regimen for other reason: Secondary | ICD-10-CM

## 2021-10-15 DIAGNOSIS — E8889 Other specified metabolic disorders: Secondary | ICD-10-CM | POA: Diagnosis present

## 2021-10-15 DIAGNOSIS — I248 Other forms of acute ischemic heart disease: Secondary | ICD-10-CM | POA: Diagnosis present

## 2021-10-15 DIAGNOSIS — E860 Dehydration: Secondary | ICD-10-CM | POA: Diagnosis present

## 2021-10-15 DIAGNOSIS — Z79899 Other long term (current) drug therapy: Secondary | ICD-10-CM

## 2021-10-15 DIAGNOSIS — Z7982 Long term (current) use of aspirin: Secondary | ICD-10-CM

## 2021-10-15 DIAGNOSIS — E1010 Type 1 diabetes mellitus with ketoacidosis without coma: Secondary | ICD-10-CM | POA: Diagnosis not present

## 2021-10-15 DIAGNOSIS — E0590 Thyrotoxicosis, unspecified without thyrotoxic crisis or storm: Secondary | ICD-10-CM | POA: Diagnosis present

## 2021-10-15 DIAGNOSIS — R262 Difficulty in walking, not elsewhere classified: Secondary | ICD-10-CM | POA: Diagnosis present

## 2021-10-15 DIAGNOSIS — M6282 Rhabdomyolysis: Secondary | ICD-10-CM | POA: Diagnosis present

## 2021-10-15 DIAGNOSIS — Z888 Allergy status to other drugs, medicaments and biological substances status: Secondary | ICD-10-CM

## 2021-10-15 LAB — GLUCOSE, CAPILLARY
Glucose-Capillary: 443 mg/dL — ABNORMAL HIGH (ref 70–99)
Glucose-Capillary: 453 mg/dL — ABNORMAL HIGH (ref 70–99)
Glucose-Capillary: 533 mg/dL (ref 70–99)
Glucose-Capillary: 538 mg/dL (ref 70–99)
Glucose-Capillary: 564 mg/dL (ref 70–99)
Glucose-Capillary: >600 mg/dL (ref 70–99)
Glucose-Capillary: >600 mg/dL (ref 70–99)
Glucose-Capillary: >600 mg/dL (ref 70–99)
Glucose-Capillary: >600 mg/dL (ref 70–99)
Glucose-Capillary: >600 mg/dL (ref 70–99)
Glucose-Capillary: >600 mg/dL (ref 70–99)
Glucose-Capillary: >600 mg/dL (ref 70–99)
Glucose-Capillary: >600 mg/dL (ref 70–99)
Glucose-Capillary: >600 mg/dL (ref 70–99)
Glucose-Capillary: >600 mg/dL (ref 70–99)

## 2021-10-15 LAB — URINALYSIS, ROUTINE W REFLEX MICROSCOPIC
Bilirubin Urine: NEGATIVE
Glucose, UA: >=500 mg/dL — AB
Ketones, ur: 20 mg/dL — AB
Nitrite: NEGATIVE
Protein, ur: 30 mg/dL — AB
Specific Gravity, Urine: 1.021 (ref 1.005–1.030)
pH: 5 (ref 5.0–8.0)

## 2021-10-15 LAB — CBC
HCT: 29.9 % — ABNORMAL LOW (ref 39.0–52.0)
Hemoglobin: 9.5 g/dL — ABNORMAL LOW (ref 13.0–17.0)
MCH: 31.9 pg (ref 26.0–34.0)
MCHC: 31.8 g/dL (ref 30.0–36.0)
MCV: 100.3 fL — ABNORMAL HIGH (ref 80.0–100.0)
Platelets: 107 10*3/uL — ABNORMAL LOW (ref 150–400)
RBC: 2.98 MIL/uL — ABNORMAL LOW (ref 4.22–5.81)
RDW: 12.1 % (ref 11.5–15.5)
WBC: 10.4 10*3/uL (ref 4.0–10.5)
nRBC: 0 % (ref 0.0–0.2)

## 2021-10-15 LAB — BASIC METABOLIC PANEL
Anion gap: 25 — ABNORMAL HIGH (ref 5–15)
Anion gap: 9 (ref 5–15)
BUN: 76 mg/dL — ABNORMAL HIGH (ref 8–23)
BUN: 72 mg/dL — ABNORMAL HIGH (ref 8–23)
CO2: 18 mmol/L — ABNORMAL LOW (ref 22–32)
CO2: 30 mmol/L (ref 22–32)
Calcium: 7.8 mg/dL — ABNORMAL LOW (ref 8.9–10.3)
Calcium: 9.1 mg/dL (ref 8.9–10.3)
Chloride: 88 mmol/L — ABNORMAL LOW (ref 98–111)
Chloride: 101 mmol/L (ref 98–111)
Creatinine, Ser: 3.03 mg/dL — ABNORMAL HIGH (ref 0.61–1.24)
Creatinine, Ser: 2.54 mg/dL — ABNORMAL HIGH (ref 0.61–1.24)
GFR, Estimated: 23 mL/min — ABNORMAL LOW (ref >60)
GFR, Estimated: 28 mL/min — ABNORMAL LOW (ref >60)
Glucose, Bld: 1070 mg/dL (ref 70–99)
Glucose, Bld: 557 mg/dL (ref 70–99)
Potassium: 5.2 mmol/L — ABNORMAL HIGH (ref 3.5–5.1)
Potassium: 4.7 mmol/L (ref 3.5–5.1)
Sodium: 131 mmol/L — ABNORMAL LOW (ref 135–145)
Sodium: 140 mmol/L (ref 135–145)

## 2021-10-15 LAB — CBC WITH DIFFERENTIAL/PLATELET
Abs Immature Granulocytes: 0.07 10*3/uL (ref 0.00–0.07)
Basophils Absolute: 0 10*3/uL (ref 0.0–0.1)
Basophils Relative: 0 %
Eosinophils Absolute: 0 10*3/uL (ref 0.0–0.5)
Eosinophils Relative: 0 %
HCT: 35.2 % — ABNORMAL LOW (ref 39.0–52.0)
Hemoglobin: 10.7 g/dL — ABNORMAL LOW (ref 13.0–17.0)
Immature Granulocytes: 1 %
Lymphocytes Relative: 3 %
Lymphs Abs: 0.4 10*3/uL — ABNORMAL LOW (ref 0.7–4.0)
MCH: 31.8 pg (ref 26.0–34.0)
MCHC: 30.4 g/dL (ref 30.0–36.0)
MCV: 104.8 fL — ABNORMAL HIGH (ref 80.0–100.0)
Monocytes Absolute: 0.6 10*3/uL (ref 0.1–1.0)
Monocytes Relative: 4 %
Neutro Abs: 13.7 10*3/uL — ABNORMAL HIGH (ref 1.7–7.7)
Neutrophils Relative %: 92 %
Platelets: 149 10*3/uL — ABNORMAL LOW (ref 150–400)
RBC: 3.36 MIL/uL — ABNORMAL LOW (ref 4.22–5.81)
RDW: 12.4 % (ref 11.5–15.5)
WBC: 14.9 10*3/uL — ABNORMAL HIGH (ref 4.0–10.5)
nRBC: 0 % (ref 0.0–0.2)

## 2021-10-15 LAB — URINALYSIS, COMPLETE (UACMP) WITH MICROSCOPIC
Bacteria, UA: NONE SEEN
Bilirubin Urine: NEGATIVE
Glucose, UA: NEGATIVE mg/dL
Hgb urine dipstick: NEGATIVE
Ketones, ur: NEGATIVE mg/dL
Leukocytes,Ua: NEGATIVE
Nitrite: NEGATIVE
Protein, ur: NEGATIVE mg/dL
Specific Gravity, Urine: 1.01 (ref 1.005–1.030)
pH: 5 (ref 5.0–8.0)

## 2021-10-15 LAB — LACTIC ACID, PLASMA
Lactic Acid, Venous: 4.6 mmol/L (ref 0.5–1.9)
Lactic Acid, Venous: 3.9 mmol/L (ref 0.5–1.9)

## 2021-10-15 LAB — COMPREHENSIVE METABOLIC PANEL
ALT: 48 U/L — ABNORMAL HIGH (ref 0–44)
AST: 134 U/L — ABNORMAL HIGH (ref 15–41)
Albumin: 3.7 g/dL (ref 3.5–5.0)
Alkaline Phosphatase: 117 U/L (ref 38–126)
Anion gap: 28 — ABNORMAL HIGH (ref 5–15)
BUN: 92 mg/dL — ABNORMAL HIGH (ref 8–23)
CO2: 11 mmol/L — ABNORMAL LOW (ref 22–32)
Calcium: 8.6 mg/dL — ABNORMAL LOW (ref 8.9–10.3)
Chloride: 83 mmol/L — ABNORMAL LOW (ref 98–111)
Creatinine, Ser: 3.45 mg/dL — ABNORMAL HIGH (ref 0.61–1.24)
GFR, Estimated: 19 mL/min — ABNORMAL LOW (ref >60)
Glucose, Bld: >1200 mg/dL (ref 70–99)
Potassium: >7.5 mmol/L (ref 3.5–5.1)
Sodium: 122 mmol/L — ABNORMAL LOW (ref 135–145)
Total Bilirubin: 1.7 mg/dL — ABNORMAL HIGH (ref 0.3–1.2)
Total Protein: 7 g/dL (ref 6.5–8.1)

## 2021-10-15 LAB — CBG MONITORING, ED
Glucose-Capillary: >600 mg/dL (ref 70–99)
Glucose-Capillary: >600 mg/dL (ref 70–99)
Glucose-Capillary: >600 mg/dL (ref 70–99)

## 2021-10-15 LAB — TROPONIN I (HIGH SENSITIVITY)
Troponin I (High Sensitivity): 542 ng/L (ref <18)
Troponin I (High Sensitivity): 529 ng/L (ref <18)

## 2021-10-15 LAB — MRSA NEXT GEN BY PCR, NASAL: MRSA by PCR Next Gen: NOT DETECTED

## 2021-10-15 LAB — CK: Total CK: 9071 U/L — ABNORMAL HIGH (ref 49–397)

## 2021-10-15 LAB — BRAIN NATRIURETIC PEPTIDE: B Natriuretic Peptide: 152.7 pg/mL — ABNORMAL HIGH (ref 0.0–100.0)

## 2021-10-15 LAB — BETA-HYDROXYBUTYRIC ACID: Beta-Hydroxybutyric Acid: 7.53 mmol/L — ABNORMAL HIGH (ref 0.05–0.27)

## 2021-10-15 MED ORDER — DEXTROSE IN LACTATED RINGERS 5 % IV SOLN: INTRAVENOUS | Status: DC

## 2021-10-15 MED ORDER — CHLORHEXIDINE GLUCONATE CLOTH 2 % EX PADS
6.0000 | MEDICATED_PAD | Freq: Every day | CUTANEOUS | Status: DC
Start: 2021-10-16 — End: 2021-10-18
  Administered 2021-10-15 – 2021-10-18 (×4): 6 via TOPICAL

## 2021-10-15 MED ORDER — LACTATED RINGERS IV SOLN: INTRAVENOUS | Status: DC

## 2021-10-15 MED ORDER — HEPARIN SODIUM (PORCINE) 5000 UNIT/ML IJ SOLN: 5000.0000 [IU] | Freq: Three times a day (TID) | INTRAMUSCULAR | Status: DC

## 2021-10-15 MED ORDER — LEVOTHYROXINE SODIUM 50 MCG PO TABS
125.0000 ug | ORAL_TABLET | Freq: Every day | ORAL | Status: DC
  Administered 2021-10-15 – 2021-10-18 (×4): 125 ug via ORAL
  Filled 2021-10-15 (×4): qty 1

## 2021-10-15 MED ORDER — BICTEGRAVIR-EMTRICITAB-TENOFOV 50-200-25 MG PO TABS
1.0000 | ORAL_TABLET | Freq: Every day | ORAL | Status: DC
  Administered 2021-10-16 – 2021-10-18 (×3): 1 via ORAL
  Filled 2021-10-15 (×3): qty 1

## 2021-10-15 MED ORDER — INSULIN REGULAR(HUMAN) IN NACL 100-0.9 UT/100ML-% IV SOLN
INTRAVENOUS | Status: DC
  Administered 2021-10-15: 16 [IU]/h via INTRAVENOUS
  Administered 2021-10-15: 11.5 [IU]/h via INTRAVENOUS
  Filled 2021-10-15 (×2): qty 100

## 2021-10-15 MED ORDER — CALCIUM GLUCONATE 10 % IV SOLN
INTRAVENOUS | Status: AC
  Filled 2021-10-15: qty 10

## 2021-10-15 MED ORDER — CARBIDOPA-LEVODOPA 25-100 MG PO TABS
1.0000 | ORAL_TABLET | Freq: Three times a day (TID) | ORAL | Status: DC
  Administered 2021-10-15 – 2021-10-18 (×8): 1 via ORAL
  Filled 2021-10-15 (×10): qty 1

## 2021-10-15 MED ORDER — VALACYCLOVIR HCL 500 MG PO TABS
500.0000 mg | ORAL_TABLET | Freq: Every day | ORAL | Status: DC
  Administered 2021-10-15 – 2021-10-18 (×4): 500 mg via ORAL
  Filled 2021-10-15 (×4): qty 1

## 2021-10-15 MED ORDER — DOCUSATE SODIUM 100 MG PO CAPS: 100.0000 mg | ORAL_CAPSULE | Freq: Two times a day (BID) | ORAL | Status: DC | PRN

## 2021-10-15 MED ORDER — DEXTROSE 50 % IV SOLN: 0.0000 mL | INTRAVENOUS | Status: DC | PRN

## 2021-10-15 MED ORDER — LORAZEPAM 2 MG/ML IJ SOLN
1.0000 mg | Freq: Once | INTRAMUSCULAR | Status: AC
  Administered 2021-10-15: 1 mg via INTRAVENOUS
  Filled 2021-10-15: qty 1

## 2021-10-15 MED ORDER — ROCURONIUM BROMIDE 10 MG/ML (PF) SYRINGE
PREFILLED_SYRINGE | INTRAVENOUS | Status: AC
  Filled 2021-10-15: qty 20

## 2021-10-15 MED ORDER — SODIUM CHLORIDE 0.9 % IV BOLUS
1000.0000 mL | Freq: Once | INTRAVENOUS | Status: AC
  Administered 2021-10-15: 1000 mL via INTRAVENOUS

## 2021-10-15 MED ORDER — INSULIN REGULAR(HUMAN) IN NACL 100-0.9 UT/100ML-% IV SOLN: INTRAVENOUS | Status: DC

## 2021-10-15 MED ORDER — CALCIUM GLUCONATE 10 % IV SOLN
1.0000 g | Freq: Once | INTRAVENOUS | Status: AC
  Administered 2021-10-15: 1 g via INTRAVENOUS
  Filled 2021-10-15: qty 10

## 2021-10-15 MED ORDER — SODIUM BICARBONATE 8.4 % IV SOLN
50.0000 meq | Freq: Once | INTRAVENOUS | Status: AC
Start: 2021-10-15 — End: 2021-10-15
  Administered 2021-10-15: 50 meq via INTRAVENOUS
  Filled 2021-10-15: qty 50

## 2021-10-15 MED ORDER — ETOMIDATE 2 MG/ML IV SOLN
INTRAVENOUS | Status: AC
  Filled 2021-10-15: qty 20

## 2021-10-15 MED ORDER — LACTATED RINGERS IV BOLUS
20.0000 mL/kg | Freq: Once | INTRAVENOUS | Status: AC
  Administered 2021-10-15: 1996 mL via INTRAVENOUS

## 2021-10-15 MED ORDER — ONDANSETRON HCL 4 MG/2ML IJ SOLN: 4.0000 mg | Freq: Four times a day (QID) | INTRAMUSCULAR | Status: DC | PRN

## 2021-10-15 MED ORDER — NOREPINEPHRINE 4 MG/250ML-% IV SOLN
INTRAVENOUS | Status: AC
  Filled 2021-10-15: qty 250

## 2021-10-15 MED ORDER — INSULIN ASPART 100 UNIT/ML IJ SOLN
10.0000 [IU] | Freq: Once | INTRAMUSCULAR | Status: AC
  Administered 2021-10-15: 10 [IU] via INTRAVENOUS
  Filled 2021-10-15: qty 1

## 2021-10-15 MED ORDER — CALCIUM GLUCONATE 10 % IV SOLN
1.0000 g | Freq: Once | INTRAVENOUS | Status: AC
  Administered 2021-10-15: 1 g via INTRAVENOUS

## 2021-10-15 MED ORDER — ORAL CARE MOUTH RINSE: 15.0000 mL | OROMUCOSAL | Status: DC | PRN

## 2021-10-15 MED ORDER — POLYETHYLENE GLYCOL 3350 17 G PO PACK: 17.0000 g | PACK | Freq: Every day | ORAL | Status: DC | PRN

## 2021-10-15 MED ORDER — STERILE WATER FOR INJECTION IV SOLN
INTRAVENOUS | Status: AC
  Filled 2021-10-15: qty 1000

## 2021-10-15 NOTE — ED Notes (Signed)
Code Stemi cancelled 

## 2021-10-15 NOTE — Plan of Care (Signed)
Patient remains on insulin and bicarb infusions. Q 30 minutes CBGs per [employer] at present.   Problem: Education: Goal: Knowledge of General Education information will improve Description: Including pain rating scale, medication(s)/side effects and non-pharmacologic comfort measures Outcome: Progressing   Problem: Health Behavior/Discharge Planning: Goal: Ability to manage health-related needs will improve Outcome: Progressing   Problem: Clinical Measurements: Goal: Ability to maintain clinical measurements within normal limits will improve Outcome: Progressing Goal: Will remain free from infection Outcome: Progressing Goal: Diagnostic test results will improve Outcome: Progressing Goal: Respiratory complications will improve Outcome: Progressing Goal: Cardiovascular complication will be avoided Outcome: Progressing   Problem: Activity: Goal: Risk for activity intolerance will decrease Outcome: Progressing   Problem: Nutrition: Goal: Adequate nutrition will be maintained Outcome: Progressing   Problem: Coping: Goal: Level of anxiety will decrease Outcome: Progressing   Problem: Elimination: Goal: Will not experience complications related to bowel motility Outcome: Progressing Goal: Will not experience complications related to urinary retention Outcome: Progressing   Problem: Pain Managment: Goal: General experience of comfort will improve Outcome: Progressing   Problem: Safety: Goal: Ability to remain free from injury will improve Outcome: Progressing   Problem: Skin Integrity: Goal: Risk for impaired skin integrity will decrease Outcome: Progressing   Problem: Education: Goal: Ability to describe self-care measures that may prevent or decrease complications (Diabetes Survival Skills Education) will improve Outcome: Progressing Goal: Individualized Educational Video(s) Outcome: Progressing   Problem: Coping: Goal: Ability to adjust to condition or change  in health will improve Outcome: Progressing   Problem: Fluid Volume: Goal: Ability to maintain a balanced intake and output will improve Outcome: Progressing   Problem: Health Behavior/Discharge Planning: Goal: Ability to identify and utilize available resources and services will improve Outcome: Progressing Goal: Ability to manage health-related needs will improve Outcome: Progressing   Problem: Metabolic: Goal: Ability to maintain appropriate glucose levels will improve Outcome: Progressing   Problem: Nutritional: Goal: Maintenance of adequate nutrition will improve Outcome: Progressing Goal: Progress toward achieving an optimal weight will improve Outcome: Progressing   Problem: Skin Integrity: Goal: Risk for impaired skin integrity will decrease Outcome: Progressing   Problem: Tissue Perfusion: Goal: Adequacy of tissue perfusion will improve Outcome: Progressing

## 2021-10-15 NOTE — Progress Notes (Signed)
   10/15/21 1410  Clinical Encounter Type  Visited With Patient and family together  Visit Type Initial;Critical Care   Chaplain Leocadio Heal responded to code STEMI.  Chaplain B offered supportive presence to pt's nephew, Joselyn Glassman, who is at bedside and who called EMS. Pt's brother, Minerva Areola, in route.  Chaplain B available to assist family in relocating to cath lab, or next steps. Will be following.

## 2021-10-15 NOTE — ED Notes (Signed)
Provider at bedside, pt not responding.

## 2021-10-15 NOTE — ED Notes (Signed)
ED TO INPATIENT HANDOFF REPORT  ED Nurse Name and Phone #: Collene Gobble Name/Age/Gender Jeffrey Pearson 61 y.o. male Room/Bed: ED11A/ED11A  Code Status   Code Status: Full Code  Home/SNF/Other Home Patient oriented to: self, place, and situation Is this baseline? No   Triage Complete: Triage complete  Chief Complaint Diabetic ketoacidosis (HCC) [E11.10]  Triage Note [facility] reports pt coming from home. Family states pt has a AMS and has not been taking his insulin, they also report pt was doing some type of fasting. Pt also has swelling in his feet which is not normal for him.   Allergies Allergies  Allergen Reactions   Septra [Sulfamethoxazole-Trimethoprim] Rash    Level of Care/Admitting Diagnosis ED Disposition     ED Disposition  Admit   Condition  --   Comment  Hospital Area: [facility] [[id number]]  Level of Care: ICU [6]  Covid Evaluation: Asymptomatic - no recent exposure (last 10 days) testing not required  Diagnosis: Diabetic ketoacidosis [facility]) [161096]  Admitting Physician: Ileana Roup [email or web address]  Attending Physician: Ileana Roup [phone]  Estimated length of stay: past midnight tomorrow  Certification:: I certify this patient will need inpatient services for at least 2 midnights          B Medical/Surgery History Past Medical History:  Diagnosis Date   Depression    Diabetes mellitus without complication (HCC)    HIV (human immunodeficiency virus infection) (HCC)    Hypertension    Hyperthyroidism    Thyroid disease    Past Surgical History:  Procedure Laterality Date   lymph nodes  1982   THYROIDECTOMY     THYROIDECTOMY, PARTIAL  1980   TONSILLECTOMY       A IV Location/Drains/Wounds Patient Lines/Drains/Airways Status     Active Line/Drains/Airways     Name Placement date Placement time Site Days   Peripheral IV 10/15/21 18 G 1" Left Antecubital 10/15/21  --  Antecubital  less than 1   Peripheral  IV 10/15/21 20 G 1" Anterior;Proximal;Right Forearm 10/15/21  1321  Forearm  less than 1   Peripheral IV 10/15/21 20 G 1" Posterior;Right Hand 10/15/21  1515  Hand  less than 1            Intake/Output Last 24 hours No intake or output data in the 24 hours ending 10/15/21 1611  Labs/Imaging Results for orders placed or performed during the hospital encounter of 10/15/21 (from the past 48 hour(s))  CBG monitoring, ED     Status: Abnormal   Collection Time: 10/15/21  1:15 PM  Result Value Ref Range   Glucose-Capillary >600 (HH) 70 - 99 mg/dL    Comment: Glucose reference range applies only to samples taken after fasting for at least 8 hours.  Troponin I (High Sensitivity)     Status: Abnormal   Collection Time: 10/15/21  1:22 PM  Result Value Ref Range   Troponin I (High Sensitivity) 542 (HH) <18 ng/L    Comment: CRITICAL RESULT CALLED TO, READ BACK BY AND VERIFIED WITH Sanchez Hemmer DEL ROSSO AT 1445 10/15/21.PMF (NOTE) Elevated high sensitivity troponin I (hsTnI) values and significant  changes across serial measurements may suggest ACS but many other  chronic and acute conditions are known to elevate hsTnI results.  Refer to the "Links" section for chest pain algorithms and additional  guidance. Performed at [facility], [address] [place], [place] [address]   CBC with Differential  Status: Abnormal   Collection Time: 10/15/21  1:22 PM  Result Value Ref Range   WBC 14.9 (H) 4.0 - 10.5 K/uL   RBC 3.36 (L) 4.22 - 5.81 MIL/uL   Hemoglobin 10.7 (L) 13.0 - 17.0 g/dL   HCT 93.8 (L) 10.1 - 75.1 %   MCV 104.8 (H) 80.0 - 100.0 fL   MCH 31.8 26.0 - 34.0 pg   MCHC 30.4 30.0 - 36.0 g/dL   RDW 02.5 85.2 - 77.8 %   Platelets 149 (L) 150 - 400 K/uL   nRBC 0.0 0.0 - 0.2 %   Neutrophils Relative % 92 %   Neutro Abs 13.7 (H) 1.7 - 7.7 K/uL   Lymphocytes Relative 3 %   Lymphs Abs 0.4 (L) 0.7 - 4.0 K/uL   Monocytes Relative 4 %   Monocytes Absolute 0.6 0.1 - 1.0 K/uL    Eosinophils Relative 0 %   Eosinophils Absolute 0.0 0.0 - 0.5 K/uL   Basophils Relative 0 %   Basophils Absolute 0.0 0.0 - 0.1 K/uL   Immature Granulocytes 1 %   Abs Immature Granulocytes 0.07 0.00 - 0.07 K/uL    Comment: Performed at [facility], [address] [place], [place] [address]  Lactic acid, plasma     Status: Abnormal   Collection Time: 10/15/21  1:22 PM  Result Value Ref Range   Lactic Acid, Venous 4.6 (HH) 0.5 - 1.9 mmol/L    Comment: CRITICAL RESULT CALLED TO, READ BACK BY AND VERIFIED WITH Maily Debarge DEL ROSSO AT 1414 10/15/21.PMF Performed at [facility], [address] [place], [place] [address]   Comprehensive metabolic panel     Status: Abnormal   Collection Time: 10/15/21  1:22 PM  Result Value Ref Range   Sodium 122 (L) 135 - 145 mmol/L    Comment: RESULTS VERIFIED BY REPEAT TESTING.PMF   Potassium >7.5 (HH) 3.5 - 5.1 mmol/L    Comment: CRITICAL RESULT CALLED TO, READ BACK BY AND VERIFIED WITH Kaleya Douse DEL ROSSO AT 1414 10/15/21.PMF    Chloride 83 (L) 98 - 111 mmol/L   CO2 11 (L) 22 - 32 mmol/L   Glucose, Bld >1,200 (HH) 70 - 99 mg/dL    Comment: CRITICAL RESULT CALLED TO, READ BACK BY AND VERIFIED WITH Shonte Beutler DEL ROSSO AT 1445 10/15/21.PMF Glucose reference range applies only to samples taken after fasting for at least 8 hours.    BUN 92 (H) 8 - 23 mg/dL   Creatinine, Ser 3.15 (H) 0.61 - 1.24 mg/dL   Calcium 8.6 (L) 8.9 - 10.3 mg/dL   Total Protein 7.0 6.5 - 8.1 g/dL   Albumin 3.7 3.5 - 5.0 g/dL   AST 400 (H) 15 - 41 U/L   ALT 48 (H) 0 - 44 U/L   Alkaline Phosphatase 117 38 - 126 U/L   Total Bilirubin 1.7 (H) 0.3 - 1.2 mg/dL   GFR, Estimated 19 (L) >60 mL/min    Comment: (NOTE) Calculated using the CKD-EPI Creatinine Equation (2021)    Anion gap 28 (H) 5 - 15    Comment: Performed at [facility], [address] [place], [place] [address]  Brain natriuretic peptide     Status: Abnormal   Collection Time: 10/15/21  1:26 PM   Result Value Ref Range   B Natriuretic Peptide 152.7 (H) 0.0 - 100.0 pg/mL    Comment: Performed at [facility], [address] [place], [place] [address]  CBG monitoring, ED  Status: Abnormal   Collection Time: 10/15/21  2:39 PM  Result Value Ref Range   Glucose-Capillary >600 (HH) 70 - 99 mg/dL    Comment: Glucose reference range applies only to samples taken after fasting for at least 8 hours.  CBG monitoring, ED     Status: Abnormal   Collection Time: 10/15/21  3:17 PM  Result Value Ref Range   Glucose-Capillary >600 (HH) 70 - 99 mg/dL    Comment: Glucose reference range applies only to samples taken after fasting for at least 8 hours.   DG Chest 1 View  Result Date: 10/15/2021 CLINICAL DATA:  Edema EXAM: CHEST  1 VIEW COMPARISON:  08/23/2018 FINDINGS: Heart size within normal limits. Aortic atherosclerosis. Low lung volumes with streaky bibasilar opacities. No pleural effusion or pneumothorax. Surgical clips at the base of the neck. IMPRESSION: Low lung volumes with streaky bibasilar opacities, likely atelectasis. Electronically Signed   By: Duanne Guess D.O.   On: 10/15/2021 14:07    Pending Labs Unresulted Labs (From admission, onward)     Start     Ordered   10/16/21 0500  CBC  Tomorrow morning,   STAT        10/15/21 1552   10/16/21 0500  Basic metabolic panel  Tomorrow morning,   STAT        10/15/21 1552   10/16/21 0500  Magnesium  Tomorrow morning,   STAT        10/15/21 1552   10/16/21 0500  Phosphorus  Tomorrow morning,   STAT        10/15/21 1552   10/15/21 1722  Basic metabolic panel  (Diabetes Ketoacidosis (DKA))  STAT Now then every 4 hours ,   R      10/15/21 1555   10/15/21 1610  Magnesium  Add-on,   AD        10/15/21 1609   10/15/21 1610  Phosphorus  Add-on,   AD        10/15/21 1609   10/15/21 1608  CK  ONCE - STAT,   R        10/15/21 1607   10/15/21 1558  TSH  Once,   R        10/15/21 1557   10/15/21 1558  T3, free  Once,   R         10/15/21 1557   10/15/21 1554  Hemoglobin A1c  (Diabetes Ketoacidosis (DKA))  Once,   R       Comments: To assess prior glycemic control.    10/15/21 1555   10/15/21 1554  Beta-hydroxybutyric acid  (Diabetes Ketoacidosis (DKA))  Now then every 8 hours,   R      10/15/21 1555   10/15/21 1550  CBC  (heparin)  Once,   STAT       Comments: Baseline for heparin therapy IF NOT ALREADY DRAWN.  Notify MD if PLT < 100 K.    10/15/21 1552   10/15/21 1550  Creatinine, serum  (heparin)  Once,   STAT       Comments: Baseline for heparin therapy IF NOT ALREADY DRAWN.    10/15/21 1552   10/15/21 1320  Lactic acid, plasma  Now then every 2 hours,   STAT      10/15/21 1320   10/15/21 1320  Urinalysis, Routine w reflex microscopic Urine, Clean Catch  Once,   URGENT        10/15/21 1320  Vitals/Pain Today's Vitals   10/15/21 1400 10/15/21 1415 10/15/21 1430 10/15/21 1445  BP: (!) 107/54 (!) 118/52 (!) 125/52 (!) 123/56  Pulse: 99 (!) 101 (!) 102 (!) 102  Resp: (!) 22 (!) 26 (!) 28 (!) 27  Temp:      TempSrc:      SpO2: 100% 100% 99% 97%  Weight:      Height:      PainSc:        Isolation Precautions No active isolations  Medications Medications  norepinephrine (LEVOPHED) 4-5 MG/250ML-% infusion SOLN (0 mcg/kg/min  Hold 10/15/21 1409)  insulin regular, human (MYXREDLIN) 100 units/ 100 mL infusion (11.5 Units/hr Intravenous New Bag/Given 10/15/21 1450)  dextrose 5 % in lactated ringers infusion (has no administration in time range)  sodium bicarbonate 150 mEq in sterile water 1,150 mL infusion ( Intravenous New Bag/Given 10/15/21 1516)  docusate sodium (COLACE) capsule 100 mg (has no administration in time range)  polyethylene glycol (MIRALAX / GLYCOLAX) packet 17 g (has no administration in time range)  heparin injection 5,000 Units (has no administration in time range)  ondansetron (ZOFRAN) injection 4 mg (has no administration in time range)  dextrose 50 % solution 0-50  mL (has no administration in time range)  sodium chloride 0.9 % bolus 1,000 mL (0 mLs Intravenous Stopped 10/15/21 1409)  insulin aspart (novoLOG) injection 10 Units (10 Units Intravenous Given 10/15/21 1325)  calcium gluconate inj 10% (1 g) URGENT USE ONLY! (1 g Intravenous Given 10/15/21 1329)  sodium bicarbonate injection 50 mEq (50 mEq Intravenous Given 10/15/21 1329)  calcium gluconate inj 10% (1 g) URGENT USE ONLY! (1 g Intravenous Given 10/15/21 1347)  sodium chloride 0.9 % bolus 1,000 mL (0 mLs Intravenous Stopped 10/15/21 1456)  lactated ringers bolus 1,996 mL (1,996 mLs Intravenous New Bag/Given 10/15/21 1436)  insulin aspart (novoLOG) injection 10 Units (10 Units Intravenous Given 10/15/21 1509)    Mobility walks Low fall risk   Focused Assessments Cardiac Assessment Handoff:    Lab Results  Component Value Date   TROPONINI <0.03 08/23/2018   No results found for: "DDIMER" Does the Patient currently have chest pain? No    R Recommendations: See Admitting Provider Note  Report given to:   Additional Notes:

## 2021-10-15 NOTE — Consult Note (Signed)
[facility]  CONSULT NOTE    Date: 10/15/2021                  Patient Name:  Jeffrey Pearson  MRN: [id number]  DOB: Dec 25, 1960  Age / Sex: 61 y.o., male         PCP: Thana Ates, MD                 Service Requesting Consult: TRH                 Reason for Consult: Acute kidney injury            History of Present Illness: Mr. Jeffrey Pearson is a 61 y.o.  male with past medical history of type 1 diabetes, HIV, hypertension, and thyroid disease., who was admitted to [facility] on 10/15/2021 for Diabetic ketoacidosis (HCC) [E11.10] Diabetic ketoacidosis without coma associated with type 1 diabetes mellitus (HCC) [E10.10]  Patient reports to the emergency department after being found altered by family members.  According to family patient has been fasting and not taking his insulin.  Patient seen resting on stretcher.  States he has fasted in the past without issues.  Appears alert and oriented at this time however some moments of confusion.  Patient unable to state how long he has been fasting.  No family at bedside at this time.  Patient is easily distracted and unable to follow conversation.  Denies chest pain or shortness of breath.  No known fever or chills.  Labs on ED arrival concerning for sodium 122, potassium greater than 7.5, serum bicarb 11, glucose greater than 1200, BUN 92, creatinine 3.45 with GFR 19, calcium 8.6, anion gap 28, and total bilirubin 1.7.  Elevated troponins at 542 with lactic acid 4.6.  Elevated WBCs 14.9 with macrocytic hemoglobin 10.7.  Chest x-ray shows streaky bibasilar opacities.  UA appears hazy with hematuria and ketones.    Medications: Outpatient medications: Medications Prior to Admission  Medication Sig Dispense Refill Last Dose   atorvastatin (LIPITOR) 40 MG tablet Take 1 tablet (40 mg total) by mouth daily at 6 PM. 30 tablet 0 Past Week   bictegravir-emtricitabine-tenofovir AF (BIKTARVY) 50-200-25 MG TABS  tablet Take 1 tablet by mouth daily.   Past Week   carbidopa-levodopa (SINEMET IR) 25-100 MG tablet Take 1 tablet by mouth 3 (three) times daily.   Past Week   citalopram (CELEXA) 40 MG tablet Take 40 mg by mouth daily.   Past Week   insulin aspart (NOVOLOG FLEXPEN) 100 UNIT/ML FlexPen Inject 20 Units into the skin 3 (three) times daily before meals.    Past Week   LEVEMIR FLEXTOUCH 100 UNIT/ML Pen Inject 25 Units into the skin 2 (two) times daily.   Past Week   levothyroxine (SYNTHROID) 125 MCG tablet Take 125 mcg by mouth daily.   Past Week   lisinopril (ZESTRIL) 20 MG tablet Take 20 mg by mouth daily.   Past Week   traZODone (DESYREL) 100 MG tablet Take 200 mg by mouth Nightly.   Past Week   valACYclovir (VALTREX) 500 MG tablet Take 500 mg by mouth daily.   Past Week   aspirin 325 MG tablet Take 325 mg by mouth daily.      clindamycin (CLEOCIN) 150 MG capsule Take 2 capsules (300 mg total) by mouth 3 (three) times daily. (Patient not taking: Reported on 10/15/2021) 42 capsule 0 Completed Course   HYDROcodone-acetaminophen (NORCO/VICODIN) 5-325 MG tablet  Take 1 tablet by mouth every 6 (six) hours as needed for moderate pain. (Patient not taking: Reported on 10/15/2021) 15 tablet 0 Completed Course    Current medications: Current Facility-Administered Medications  Medication Dose Route Frequency Provider Last Rate Last Admin   [START ON 10/16/2021] Chlorhexidine Gluconate Cloth 2 % PADS 6 each  6 each Topical Q0600 Ezequiel Essex, NP       dextrose 5 % in lactated ringers infusion   Intravenous Continuous Sharman Cheek, MD       dextrose 50 % solution 0-50 mL  0-50 mL Intravenous PRN Ezequiel Essex, NP       docusate sodium (COLACE) capsule 100 mg  100 mg Oral BID PRN Ezequiel Essex, NP       heparin injection 5,000 Units  5,000 Units Subcutaneous Q8H Ezequiel Essex, NP       insulin regular, human (MYXREDLIN) 100 units/ 100 mL infusion   Intravenous Continuous Sharman Cheek, MD 11.5  mL/hr at 10/15/21 1655 11.5 Units/hr at 10/15/21 1655   norepinephrine (LEVOPHED) 4-5 MG/250ML-% infusion SOLN        Held at 10/15/21 1409   ondansetron (ZOFRAN) injection 4 mg  4 mg Intravenous Q6H PRN Ezequiel Essex, NP       polyethylene glycol (MIRALAX / GLYCOLAX) packet 17 g  17 g Oral Daily PRN Ezequiel Essex, NP       sodium bicarbonate 150 mEq in sterile water 1,150 mL infusion   Intravenous Continuous Sharman Cheek, MD 150 mL/hr at 10/15/21 1516 New Bag at 10/15/21 1516      Allergies: Allergies  Allergen Reactions   Septra [Sulfamethoxazole-Trimethoprim] Rash      Past Medical History: Past Medical History:  Diagnosis Date   Depression    Diabetes mellitus without complication (HCC)    HIV (human immunodeficiency virus infection) (HCC)    Hypertension    Hyperthyroidism    Thyroid disease      Past Surgical History: Past Surgical History:  Procedure Laterality Date   lymph nodes  1982   THYROIDECTOMY     THYROIDECTOMY, PARTIAL  1980   TONSILLECTOMY       Family History: Family History  Problem Relation Age of Onset   Dementia Mother    Prostate cancer Father      Social History: Social History   Socioeconomic History   Marital status: Single    Spouse name: Not on file   Number of children: Not on file   Years of education: Not on file   Highest education level: Not on file  Occupational History   Not on file  Tobacco Use   Smoking status: Never   Smokeless tobacco: Never  Vaping Use   Vaping Use: Never used  Substance and Sexual Activity   Alcohol use: No   Drug use: Never   Sexual activity: Not on file  Other Topics Concern   Not on file  Social History Narrative   Not on file   Social Determinants of Health   Financial Resource Strain: Not on file  Food Insecurity: Not on file  Transportation Needs: Not on file  Physical Activity: Not on file  Stress: Not on file  Social Connections: Not on file  Intimate Partner  Violence: Not on file     Review of Systems: Review of Systems  Unable to perform ROS: Mental status change    Vital Signs: Blood pressure 137/69, pulse 97, temperature (!) 97.4 F (36.3  C), temperature source Oral, resp. rate (!) 23, height 5\' 9"  (1.753 m), weight 101.8 kg, SpO2 96 %.  Weight trends: Filed Weights   10/15/21 1312 10/15/21 1648  Weight: 99.8 kg 101.8 kg    Physical Exam: General: NAD, resting comfortably  Head: Normocephalic, atraumatic. Moist oral mucosal membranes  Eyes: Anicteric  Lungs:  Clear to auscultation, normal effort, [place] O2  Heart: Regular rate and rhythm  Abdomen:  Soft, nontender  Extremities:  trace peripheral edema.  Neurologic: Alert, some confusion, moving all four extremities  Skin: No lesions  Access: None     Lab results: Basic Metabolic Panel: Recent Labs  Lab 10/15/21 1322  NA 122*  K >7.5*  CL 83*  CO2 11*  GLUCOSE >1,200*  BUN 92*  CREATININE 3.45*  CALCIUM 8.6*    Liver Function Tests: Recent Labs  Lab 10/15/21 1322  AST 134*  ALT 48*  ALKPHOS 117  BILITOT 1.7*  PROT 7.0  ALBUMIN 3.7   No results for input(s): "LIPASE", "AMYLASE" in the last 168 hours. No results for input(s): "AMMONIA" in the last 168 hours.  CBC: Recent Labs  Lab 10/15/21 1322 10/15/21 1553  WBC 14.9* 10.4  NEUTROABS 13.7*  --   HGB 10.7* 9.5*  HCT 35.2* 29.9*  MCV 104.8* 100.3*  PLT 149* 107*    Cardiac Enzymes: No results for input(s): "CKTOTAL", "CKMB", "CKMBINDEX", "TROPONINI" in the last 168 hours.  BNP: Invalid input(s): "POCBNP"  CBG: Recent Labs  Lab 10/15/21 1315 10/15/21 1439 10/15/21 1517 10/15/21 1557  GLUCAP >600* >600* >600* >600*    Microbiology: Results for orders placed or performed during the hospital encounter of 03/11/19  SARS CORONAVIRUS 2 (TAT 6-24 HRS) Nasopharyngeal Nasopharyngeal Swab     Status: None   Collection Time: 03/11/19 10:32 PM   Specimen: Nasopharyngeal Swab  Result Value Ref  Range Status   SARS Coronavirus 2 NEGATIVE NEGATIVE Final    Comment: (NOTE) SARS-CoV-2 target nucleic acids are NOT DETECTED. The SARS-CoV-2 RNA is generally detectable in upper and lower respiratory specimens during the acute phase of infection. Negative results do not preclude SARS-CoV-2 infection, do not rule out co-infections with other pathogens, and should not be used as the sole basis for treatment or other patient management decisions. Negative results must be combined with clinical observations, patient history, and epidemiological information. The expected result is Negative. Fact Sheet for Patients: 13/09/20 Fact Sheet for Healthcare Providers: HairSlick.no This test is not yet approved or cleared by the quierodirigir.com FDA and  has been authorized for detection and/or diagnosis of SARS-CoV-2 by FDA under an Emergency Use Authorization (EUA). This EUA will remain  in effect (meaning this test can be used) for the duration of the COVID-19 declaration under Section 56 4(b)(1) of the Act, 21 U.S.C. section 360bbb-3(b)(1), unless the authorization is terminated or revoked sooner. Performed at [facility], [address], [place], [place]     Coagulation Studies: No results for input(s): "LABPROT", "INR" in the last 72 hours.  Urinalysis: Recent Labs    10/15/21 1553  COLORURINE YELLOW*  LABSPEC 1.021  PHURINE 5.0  GLUCOSEU >=500*  HGBUR LARGE*  BILIRUBINUR NEGATIVE  KETONESUR 20*  PROTEINUR 30*  NITRITE NEGATIVE  LEUKOCYTESUR TRACE*      Imaging: DG Chest 1 View  Result Date: 10/15/2021 CLINICAL DATA:  Edema EXAM: CHEST  1 VIEW COMPARISON:  08/23/2018 FINDINGS: Heart size within normal limits. Aortic atherosclerosis. Low lung volumes with streaky bibasilar opacities.  No pleural effusion or pneumothorax. Surgical clips at the base of the neck. IMPRESSION: Low lung volumes with  streaky bibasilar opacities, likely atelectasis. Electronically Signed   By: Duanne Guess D.O.   On: 10/15/2021 14:07     Assessment & Plan: Mr. Arlan Birks Pearson is a 61 y.o.  male with past medical history of type 1 diabetes, HIV, hypertension, and thyroid disease., who was admitted to [facility] on 10/15/2021 for Diabetic ketoacidosis (HCC) [E11.10] Diabetic ketoacidosis without coma associated with type 1 diabetes mellitus (HCC) [E10.10]  Acute kidney injury with hyperkalemia.  Normal kidney function noted on labs on March/2/23.  Acute kidney injury likely due to hypovolemia as patient has been fasting.  Potassium on admission greater than 7.5.  Patient received hyperkalemia protocol with bicarb, calcium gluconate and insulin.  Awaiting updated values.  No IV contrast exposure.  No acute indication for dialysis at this time.  Agree with IV fluids to assist in restoration of renal function.  We will continue to monitor renal function and assist in management and hyperkalemia.  2.  Diabetic ketoacidosis with diabetes mellitus type 1.  Patient admits to fasting without taking scheduled insulin.  Glucose on admission greater than 1200.  Patient currently receiving insulin drip.  3.  Acute metabolic acidosis.  Serum bicarb 11.  Recommend sodium bicarb infusion at 150 mL/h for volume resuscitation and electrolyte correction.     LOS: 0 Japleen Tornow 6/16/20234:56 PM

## 2021-10-15 NOTE — ED Notes (Signed)
Activated Code Stemi w/[facility] 

## 2021-10-15 NOTE — ED Provider Notes (Signed)
Patient seen in a shared encounter with nurse practitioner Kem Boroughs   .Critical Care  Performed by: Sharman Cheek, MD Authorized by: Sharman Cheek, MD   Critical care provider statement:    Critical care time (minutes):  45   Critical care time was exclusive of:  Separately billable procedures and treating other patients   Critical care was necessary to treat or prevent imminent or life-threatening deterioration of the following conditions:  Metabolic crisis, endocrine crisis, dehydration, cardiac failure and renal failure   Critical care was time spent personally by me on the following activities:  Development of treatment plan with patient or surrogate, discussions with consultants, evaluation of patient's response to treatment, examination of patient, obtaining history from patient or surrogate, ordering and performing treatments and interventions, ordering and review of laboratory studies, ordering and review of radiographic studies, pulse oximetry, re-evaluation of patient's condition and review of old charts   Care discussed with: admitting provider        EKG interpreted by me Initial EKG shows wide-complex rhythm, rate of 93, left axis, peaked T waves concerning for critical hyperkalemia.  There is some ST elevation in V1 and V2, but not concerning for STEMI.  Repeat EKG at 1:39 PM and 1:45 PM shows narrow QRS complex after receiving insulin, bicarb, calcium, but persistent ST elevation in V1 and V2.   ----------------------------------------- 2:41 PM on 10/15/2021 ----------------------------------------- EKG abnormalities were discussed with Dr. Kirke Corin who evaluated the patient and agrees that EKG abnormality highly likely to be due to electrolytes and recommend continued resuscitation, not a candidate for urgent cardiac catheterization.  Lab notified of potassium level greater than 7.5.  Highly suspect DKA as well.  Discussed with nephrology Dr. Thedore Mins who recommends  isotonic bicarb infusion along with volume resuscitation to restore renal function for electrolyte correction.       Sharman Cheek, MD 10/15/21 1445

## 2021-10-15 NOTE — ED Provider Notes (Signed)
[facility] Provider Note    Event Date/Time   First MD Initiated Contact with Patient 10/15/21 1312     (approximate)   History   Hyperglycemia   HPI  Jeffrey Pearson is a 61 y.o. male presents to the emergency department from home. Family report AMS to EMS. Glucose over 600 enroute. He has been on a fasting diet to try and lose weight. Patient states that he has not had his insulin in about 5 days, but he is also unable to state where he is so  that may not be accurate.   Past Medical History:  Diagnosis Date   Depression    Diabetes mellitus without complication (HCC)    HIV (human immunodeficiency virus infection) (HCC)    Hypertension    Hyperthyroidism    Thyroid disease      Physical Exam   Triage Vital Signs: ED Triage Vitals  Enc Vitals Group     BP --      Pulse --      Resp --      Temp --      Temp src --      SpO2 --      Weight 10/15/21 1312 220 lb (99.8 kg)     Height 10/15/21 1312 5\' 9"  (1.753 m)     Head Circumference --      Peak Flow --      Pain Score 10/15/21 1311 0     Pain Loc --      Pain Edu? --      Excl. in GC? --     Most recent vital signs: Vitals:   10/15/21 1900 10/15/21 1930  BP: 123/63   Pulse: 96 92  Resp: 15 (!) 30  Temp:  98.8 F (37.1 C)  SpO2: 98% 100%    General: Lethargic CV:  Good peripheral perfusion.  Resp:  Normal effort.  Breath sounds clear to auscultation Abd:  No distention.  Other:  2+ lower extremity pitting edema   ED Results / Procedures / Treatments   Labs (all labs ordered are listed, but only abnormal results are displayed) Labs Reviewed  CBC WITH DIFFERENTIAL/PLATELET - Abnormal; Notable for the following components:      Result Value   WBC 14.9 (*)    RBC 3.36 (*)    Hemoglobin 10.7 (*)    HCT 35.2 (*)    MCV 104.8 (*)    Platelets 149 (*)    Neutro Abs 13.7 (*)    Lymphs Abs 0.4 (*)    All other components within normal limits  LACTIC ACID,  PLASMA - Abnormal; Notable for the following components:   Lactic Acid, Venous 4.6 (*)    All other components within normal limits  LACTIC ACID, PLASMA - Abnormal; Notable for the following components:   Lactic Acid, Venous 3.9 (*)    All other components within normal limits  COMPREHENSIVE METABOLIC PANEL - Abnormal; Notable for the following components:   Sodium 122 (*)    Potassium >7.5 (*)    Chloride 83 (*)    CO2 11 (*)    Glucose, Bld >1,200 (*)    BUN 92 (*)    Creatinine, Ser 3.45 (*)    Calcium 8.6 (*)    AST 134 (*)    ALT 48 (*)    Total Bilirubin 1.7 (*)    GFR, Estimated 19 (*)    Anion gap 28 (*)  All other components within normal limits  URINALYSIS, ROUTINE W REFLEX MICROSCOPIC - Abnormal; Notable for the following components:   Color, Urine YELLOW (*)    APPearance HAZY (*)    Glucose, UA >=500 (*)    Hgb urine dipstick LARGE (*)    Ketones, ur 20 (*)    Protein, ur 30 (*)    Leukocytes,Ua TRACE (*)    Bacteria, UA RARE (*)    All other components within normal limits  BRAIN NATRIURETIC PEPTIDE - Abnormal; Notable for the following components:   B Natriuretic Peptide 152.7 (*)    All other components within normal limits  CBC - Abnormal; Notable for the following components:   RBC 2.98 (*)    Hemoglobin 9.5 (*)    HCT 29.9 (*)    MCV 100.3 (*)    Platelets 107 (*)    All other components within normal limits  BASIC METABOLIC PANEL - Abnormal; Notable for the following components:   Sodium 131 (*)    Potassium 5.2 (*)    Chloride 88 (*)    CO2 18 (*)    Glucose, Bld 1,070 (*)    BUN 76 (*)    Creatinine, Ser 3.03 (*)    Calcium 7.8 (*)    GFR, Estimated 23 (*)    Anion gap 25 (*)    All other components within normal limits  BETA-HYDROXYBUTYRIC ACID - Abnormal; Notable for the following components:   Beta-Hydroxybutyric Acid 7.53 (*)    All other components within normal limits  CK - Abnormal; Notable for the following components:   Total  CK 9,071 (*)    All other components within normal limits  URINALYSIS, COMPLETE (UACMP) WITH MICROSCOPIC - Abnormal; Notable for the following components:   Color, Urine YELLOW (*)    APPearance CLEAR (*)    All other components within normal limits  GLUCOSE, CAPILLARY - Abnormal; Notable for the following components:   Glucose-Capillary >600 (*)    All other components within normal limits  GLUCOSE, CAPILLARY - Abnormal; Notable for the following components:   Glucose-Capillary >600 (*)    All other components within normal limits  GLUCOSE, CAPILLARY - Abnormal; Notable for the following components:   Glucose-Capillary >600 (*)    All other components within normal limits  GLUCOSE, CAPILLARY - Abnormal; Notable for the following components:   Glucose-Capillary >600 (*)    All other components within normal limits  GLUCOSE, CAPILLARY - Abnormal; Notable for the following components:   Glucose-Capillary >600 (*)    All other components within normal limits  GLUCOSE, CAPILLARY - Abnormal; Notable for the following components:   Glucose-Capillary >600 (*)    All other components within normal limits  GLUCOSE, CAPILLARY - Abnormal; Notable for the following components:   Glucose-Capillary >600 (*)    All other components within normal limits  CBG MONITORING, ED - Abnormal; Notable for the following components:   Glucose-Capillary >600 (*)    All other components within normal limits  CBG MONITORING, ED - Abnormal; Notable for the following components:   Glucose-Capillary >600 (*)    All other components within normal limits  CBG MONITORING, ED - Abnormal; Notable for the following components:   Glucose-Capillary >600 (*)    All other components within normal limits  TROPONIN I (HIGH SENSITIVITY) - Abnormal; Notable for the following components:   Troponin I (High Sensitivity) 542 (*)    All other components within normal limits  TROPONIN I (HIGH SENSITIVITY) -  Abnormal; Notable for  the following components:   Troponin I (High Sensitivity) 529 (*)    All other components within normal limits  MRSA NEXT GEN BY PCR, NASAL  BASIC METABOLIC PANEL  BASIC METABOLIC PANEL  HEMOGLOBIN A1C  BETA-HYDROXYBUTYRIC ACID  TSH  MAGNESIUM  PHOSPHORUS  T3, FREE  CBC  BASIC METABOLIC PANEL  MAGNESIUM  PHOSPHORUS  BETA-HYDROXYBUTYRIC ACID     EKG  Sinus rhythm with a rate of 95 ST elevation anterior septal leads.   RADIOLOGY  Chest x-ray shows streaky bibasilar opacities  I have independently reviewed and interpreted imaging as well as reviewed report from radiology.  PROCEDURES:  Critical Care performed: Yes, see critical care procedure note(s)  Procedures   MEDICATIONS ORDERED IN ED:  See [facility]   IMPRESSION / MDM / ASSESSMENT AND PLAN / ED COURSE   I reviewed the triage vital signs and the nursing notes.  Differential diagnosis includes, but is not limited to: DKA, sepsis head injury, encephalopathy  Patient's presentation is most consistent with acute presentation with potential threat to life or bodily function.  The patient is on the cardiac monitor to evaluate for evidence of arrhythmia and/or significant heart rate changes.  61 year old male presenting to the emergency department via EMS after being found altered by his family.  Glucose was greater than 600 on EMS reading.  Upon arrival to the emergency department, the patient is lethargic but can be awakened by voice.  He is confused to place and time but is able to state his name.  On exam, he does have 2+ pitting edema in the lower extremities.  No JVD appreciated.  Bedside ECG tracing shows an obvious peaked T and wide QRS complex.  IV fluids are infusing and calcium and bicarb ordered.  Patient experienced an episode of agonal respirations, increased tachycardia, and decreased responsiveness just prior to administration of the calcium bicarb.  Dr. Scotty Court, ED attending, at bedside.  Patient  placed on code pads and monitor and plan for intubation was discussed with nursing staff.  Just after administration, heart rate began to slow down and patient became more responsive.  Respiratory pattern returned to normal and he was able to speak although still very confused.   EKG somewhat concerning for anterior lateral MI.  Dr. Kirke Corin paged for consult and assessed the patient at bedside.  Labs show a potassium greater than 7.5 and a glucose of over 1200 and anion gap of 28.  CBC shows mild leukocytosis of 14.9.  Initial troponin is 529.  Dr. Scotty Court has discussed with the patient with the hospitalist who advises he will require admission through ICU.  Intensivist has accepted the patient for admission to ICU.  CRITICAL CARE Performed by: Kem Boroughs   Total critical care time: 45 minutes  Critical care time was exclusive of separately billable procedures and treating other patients.  Critical care was necessary to treat or prevent imminent or life-threatening deterioration.  Critical care was time spent personally by me on the following activities: development of treatment plan with patient and/or surrogate as well as nursing, discussions with consultants, evaluation of patient's response to treatment, examination of patient, obtaining history from patient or surrogate, ordering and performing treatments and interventions, ordering and review of laboratory studies, ordering and review of radiographic studies, pulse oximetry and re-evaluation of patient's condition.      FINAL CLINICAL IMPRESSION(S) / ED DIAGNOSES   Final diagnoses:  Diabetic ketoacidosis without coma associated with type 1 diabetes mellitus (HCC)  Rx / DC Orders   ED Discharge Orders     None        Note:  This document was prepared using Dragon voice recognition software and may include unintentional dictation errors.   Chinita Pester, FNP 10/15/21 1954    Sharman Cheek, MD 10/16/21  [id number]

## 2021-10-15 NOTE — H&P (Signed)
NAME:  Jeffrey Pearson, MRN:  [id number], DOB:  1961/03/19, LOS: 0 ADMISSION DATE:  10/15/2021, CONSULTATION DATE: 10/15/2021 REFERRING MD: Dr. Scotty Court, CHIEF COMPLAINT: Altered Mental Status    History of Present Illness:  This is a 61 yo male with a PMH of Uncontrolled Type I Diabetes Mellitus who presented to [facility] ER via EMS from home on 06/16 with altered mental status.  According to the pt and pts family at bedside the pt has been doing intermittent fasting over the past 2 weeks and not taking his medications.  He has also had bilateral lower extremity swelling along with worsening tremors and difficulty walking due to his noncompliance with his Parkinson's Disease medication.  ED Course  Upon arrival to the ER lab results revealed Na+ 122, K+ >7.5, chloride 83, CO2 11, glucose >1,200, BUN 92, creatinine 3.45, calcium 8.6, anion gap 28, ASt 134, alt 48, BNP 152.7, troponin 542, lactic acid 4.6, wbc 14.9, hgb 10.7, and platelets 149.  Pt ruled in for DKA and insulin gtt initiated.  CXR concerning for atelectasis.  Initial EKG revealed wide QRS complex, peaked T waves, with slight ST elevation in V1 and V2 concerning for STEMI.  ER provider notified Cardiologist Dr. Kirke Corin who concluded EKG changes likely due to electrolyte abnormality, and pt deemed not a candidate for urgent cardiac catheterization, but recommended continued resuscitation.  Nephrology also consulted due to severe hyperkalemia concluded electrolyte abnormality due to DKA, and recommended initiation of isotonic bicarb infusion along with volume resuscitation.  PCCM team contacted for ICU admission.    Pertinent  Medical History  HIV (managed by [facility]) Uncontrolled Type I Diabetes Mellitus  Hepatitis B Tinea Pedis Syphilis  Erectile Dysfunction  HTN  Hypothyroidism s/p Subtotal Thyroidectomy  Depression  HLD  Significant Hospital Events: Including procedures, antibiotic start and stop dates in addition to other  pertinent events   06/16: Pt admitted to ICU with severe DKA, acute kidney injury with hyperkalemia, anion gap metabolic and lactic acidosis, acute metabolic encephalopathy, and elevated troponin requiring insulin gtt   Interim History / Subjective:  Pt with severe tremors secondary to parkinson's disease and confused to situation along with time.  Vital signs stable.  Insulin and sodium bicarb gtts infusing   Objective   Blood pressure (!) 123/56, pulse (!) 102, temperature 98.2 F (36.8 C), temperature source Oral, resp. rate (!) 27, height 5\' 9"  (1.753 m), weight 99.8 kg, SpO2 97 %.       No intake or output data in the 24 hours ending 10/15/21 1558 Filed Weights   10/15/21 1312  Weight: 99.8 kg    Examination: General: Acute on chronically ill appearing male, severe tremors resting in bed  HENT: Supple, no JVD  Lungs: Faint rhonchi bilateral upper lobes/clear all other lobes, even, non labored  Cardiovascular: Sinus tach, s1s2, no r/g, 2+ radial/1+ distal pulses, 2+ left lower extremity pitting edema/3+ right lower extremity pitting edema  Abdomen: +BS x4, obese, soft, non tender, non distended  Extremities: Bilateral foot drop, moves all extremities  Neuro: Alert to self only, follows commands, PERRLA  GU: Deferred   Resolved Hospital Problem list     Assessment & Plan:  Severe Diabetes ketoacidosis secondary to medication noncompliance  Hx: Type 1 diabetes mellitus  - Continue insulin gtt until anion gap closed and serum CO2 20 or higher - BMP q4hrs and beta-hydroxybutyric acid q8hrs while on insulin gtt - IV fluids per DKA protocol  - Hemoglobin A1c pending  -  Diabetes coordinator consulted appreciate input   Elevated troponin secondary to demand ischemia in setting of DKA vs. 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 bicuspid    (congenitally malformed) with moderately thickened leaflets     with mildly reduced excursion Hx: HTN and HLD  - Continuous telemetry monitoring  - Trend troponin's until peaked  - Will hold outpatient atorvastatin for now due to elevated liver enzymes - Continue outpatient aspirin  - Echo pending  - Cardiology consulted by EDP pt not candidate for urgent cardiac catheterization   Acute kidney injury with anion gap metabolic acidosis and hyperkalemia and hyponatremia in the setting of DKA  Lactic acidosis  - Trend BMP and lactic acid  - CK pending  - Replace electrolytes as indicated  - Monitor UOP - Avoid nephrotoxic medications  - Per nephrology recommendations continue sodium bicarb gtt for now  HIV -Continue outpatient biktarvy   Elevated liver enzymes  - Trend hepatic function panel   Anemia and thrombocytopenia without obvious acute blood loss - Trend CBC  - Monitor for s/sx of bleeding and transfuse for hgb <7  Hypothyroidism s/p subtotal thyroidectomy  -TSH and free T3 results pending  - Resume outpatient synthroid  Parkinson's Disease - Resume outpatient sinemet-ir   Acute metabolic encephalopathy secondary to DKA  - Avoid sedating medications  - Supportive care   Best Practice (right click and "Reselect all SmartList Selections" daily)   Diet/type: NPO w/ oral meds DVT prophylaxis: prophylactic heparin  GI prophylaxis: N/A Lines: N/A Foley:  N/A Code Status:  full code Last date of multidisciplinary goals of care discussion [N/A]  Updated pts brother and nephew at bedside regarding pts condition and current plan of care  Labs   CBC: Recent Labs  Lab 10/15/21 1322  WBC 14.9*  NEUTROABS 13.7*  HGB 10.7*  HCT 35.2*  MCV 104.8*  PLT 149*    Basic Metabolic Panel: Recent Labs  Lab 10/15/21 1322  NA 122*  K >7.5*  CL 83*  CO2 11*  GLUCOSE >1,200*  BUN 92*  CREATININE 3.45*  CALCIUM 8.6*   GFR: Estimated Creatinine Clearance: 26.2 mL/min (A) (by C-G formula based on SCr of 3.45 mg/dL (H)). Recent  Labs  Lab 10/15/21 1322  WBC 14.9*  LATICACIDVEN 4.6*    Liver Function Tests: Recent Labs  Lab 10/15/21 1322  AST 134*  ALT 48*  ALKPHOS 117  BILITOT 1.7*  PROT 7.0  ALBUMIN 3.7   No results for input(s): "LIPASE", "AMYLASE" in the last 168 hours. No results for input(s): "AMMONIA" in the last 168 hours.  ABG    Component Value Date/Time   HCO3 29.1 (H) 03/11/2019 1847   O2SAT 76.9 03/11/2019 1847     Coagulation Profile: No results for input(s): "INR", "PROTIME" in the last 168 hours.  Cardiac Enzymes: No results for input(s): "CKTOTAL", "CKMB", "CKMBINDEX", "TROPONINI" in the last 168 hours.  HbA1C: Hgb A1c MFr Bld  Date/Time Value Ref Range Status  03/11/2019 10:32 PM 11.2 (H) 4.8 - 5.6 % Final    Comment:    (NOTE) Pre diabetes:          5.7%-6.4% Diabetes:              >6.4% Glycemic control for   <7.0% adults with diabetes   09/21/2018 06:11 AM 11.8 (H) 4.8 - 5.6 % Final    Comment:    (NOTE) Pre diabetes:  5.7%-6.4% Diabetes:              >6.4% Glycemic control for   <7.0% adults with diabetes     CBG: Recent Labs  Lab 10/15/21 1315 10/15/21 1439 10/15/21 1517  GLUCAP >600* >600* >600*    Review of Systems:   Gen: Denies fever, chills, weight change, fatigue, night sweats HEENT: Denies blurred vision, double vision, hearing loss, tinnitus, sinus congestion, rhinorrhea, sore throat, neck stiffness, dysphagia PULM: Denies shortness of breath, cough, sputum production, hemoptysis, wheezing CV: chest pain, edema, orthopnea, paroxysmal nocturnal dyspnea, palpitations GI: Denies abdominal pain, nausea, vomiting, diarrhea, hematochezia, melena, constipation, change in bowel habits GU: Denies dysuria, hematuria, polyuria, oliguria, urethral discharge Endocrine: Denies hot or cold intolerance, polyuria, polyphagia or appetite change Derm: Denies rash, dry skin, scaling or peeling skin change Heme: Denies easy bruising, bleeding, bleeding  gums Neuro: confusion,headache, numbness, weakness, slurred speech, loss of memory or consciousness   Past Medical History:  He,  has a past medical history of Depression, Diabetes mellitus without complication (HCC), HIV (human immunodeficiency virus infection) (HCC), Hypertension, Hyperthyroidism, and Thyroid disease.   Surgical History:   Past Surgical History:  Procedure Laterality Date   lymph nodes  1982   THYROIDECTOMY     THYROIDECTOMY, PARTIAL  1980   TONSILLECTOMY       Social History:   reports that he has never smoked. He has never used smokeless tobacco. He reports that he does not drink alcohol and does not use drugs.   Family History:  His family history includes Dementia in his mother; Prostate cancer in his father.   Allergies Allergies  Allergen Reactions   Septra [Sulfamethoxazole-Trimethoprim] Rash     Home Medications  Prior to Admission medications   Medication Sig Start Date End Date Taking? Authorizing Provider  atorvastatin (LIPITOR) 40 MG tablet Take 1 tablet (40 mg total) by mouth daily at 6 PM. 09/22/18  Yes Altamese Dilling, MD  bictegravir-emtricitabine-tenofovir AF (BIKTARVY) 50-200-25 MG TABS tablet Take 1 tablet by mouth daily. 07/16/18  Yes [provider]  carbidopa-levodopa (SINEMET IR) 25-100 MG tablet Take 1 tablet by mouth 3 (three) times daily. 08/24/21  Yes [provider]  citalopram (CELEXA) 40 MG tablet Take 40 mg by mouth daily. 02/01/19  Yes [provider]  insulin aspart (NOVOLOG FLEXPEN) 100 UNIT/ML FlexPen Inject 20 Units into the skin 3 (three) times daily before meals.    Yes [provider]  LEVEMIR FLEXTOUCH 100 UNIT/ML Pen Inject 25 Units into the skin 2 (two) times daily. 02/07/19  Yes [provider]  levothyroxine (SYNTHROID) 125 MCG tablet Take 125 mcg by mouth daily. 01/22/19  Yes [provider]  lisinopril (ZESTRIL) 20 MG tablet Take 20 mg by mouth daily. 02/01/19   Yes [provider]  traZODone (DESYREL) 100 MG tablet Take 200 mg by mouth Nightly. 06/04/18  Yes [provider]  valACYclovir (VALTREX) 500 MG tablet Take 500 mg by mouth daily. 02/07/19  Yes [provider]  aspirin 325 MG tablet Take 325 mg by mouth daily. 01/03/19   [provider]  clindamycin (CLEOCIN) 150 MG capsule Take 2 capsules (300 mg total) by mouth 3 (three) times daily. Patient not taking: Reported on 10/15/2021 08/03/19   Faythe Ghee, PA-C  HYDROcodone-acetaminophen (NORCO/VICODIN) 5-325 MG tablet Take 1 tablet by mouth every 6 (six) hours as needed for moderate pain. Patient not taking: Reported on 10/15/2021 08/03/19   Faythe Ghee, PA-C  Critical care time: 60 minutes     Zada Girt, AGNP  Pulmonary/Critical Care Pager [phone] (please enter 7 digits) PCCM Consult Pager [phone] (please enter 7 digits)

## 2021-10-15 NOTE — ED Triage Notes (Signed)
[facility] reports pt coming from home. Family states pt has a AMS and has not been taking his insulin, they also report pt was doing some type of fasting. Pt also has swelling in his feet which is not normal for him.

## 2021-10-16 ENCOUNTER — Inpatient Hospital Stay
Admit: 2021-10-16 | Discharge: 2021-10-16 | Disposition: A | Discharge status: Home / Self Care | Payer: BLUE CROSS/BLUE SHIELD | Attending: Critical Care Medicine | Admitting: Critical Care Medicine

## 2021-10-16 DIAGNOSIS — E1010 Type 1 diabetes mellitus with ketoacidosis without coma: Secondary | ICD-10-CM | POA: Diagnosis not present

## 2021-10-16 LAB — BASIC METABOLIC PANEL
Anion gap: 10 (ref 5–15)
Anion gap: 8 (ref 5–15)
Anion gap: 7 (ref 5–15)
Anion gap: 6 (ref 5–15)
Anion gap: 9 (ref 5–15)
BUN: 63 mg/dL — ABNORMAL HIGH (ref 8–23)
BUN: 62 mg/dL — ABNORMAL HIGH (ref 8–23)
BUN: 55 mg/dL — ABNORMAL HIGH (ref 8–23)
BUN: 50 mg/dL — ABNORMAL HIGH (ref 8–23)
BUN: 48 mg/dL — ABNORMAL HIGH (ref 8–23)
CO2: 31 mmol/L (ref 22–32)
CO2: 33 mmol/L — ABNORMAL HIGH (ref 22–32)
CO2: 34 mmol/L — ABNORMAL HIGH (ref 22–32)
CO2: 34 mmol/L — ABNORMAL HIGH (ref 22–32)
CO2: 32 mmol/L (ref 22–32)
Calcium: 9.1 mg/dL (ref 8.9–10.3)
Calcium: 9.3 mg/dL (ref 8.9–10.3)
Calcium: 9.4 mg/dL (ref 8.9–10.3)
Calcium: 9.2 mg/dL (ref 8.9–10.3)
Calcium: 8.9 mg/dL (ref 8.9–10.3)
Chloride: 104 mmol/L (ref 98–111)
Chloride: 108 mmol/L (ref 98–111)
Chloride: 108 mmol/L (ref 98–111)
Chloride: 109 mmol/L (ref 98–111)
Chloride: 106 mmol/L (ref 98–111)
Creatinine, Ser: 2.17 mg/dL — ABNORMAL HIGH (ref 0.61–1.24)
Creatinine, Ser: 1.88 mg/dL — ABNORMAL HIGH (ref 0.61–1.24)
Creatinine, Ser: 1.61 mg/dL — ABNORMAL HIGH (ref 0.61–1.24)
Creatinine, Ser: 1.52 mg/dL — ABNORMAL HIGH (ref 0.61–1.24)
Creatinine, Ser: 1.45 mg/dL — ABNORMAL HIGH (ref 0.61–1.24)
GFR, Estimated: 34 mL/min — ABNORMAL LOW (ref >60)
GFR, Estimated: 40 mL/min — ABNORMAL LOW (ref >60)
GFR, Estimated: 48 mL/min — ABNORMAL LOW (ref >60)
GFR, Estimated: 52 mL/min — ABNORMAL LOW (ref >60)
GFR, Estimated: 55 mL/min — ABNORMAL LOW (ref >60)
Glucose, Bld: 313 mg/dL — ABNORMAL HIGH (ref 70–99)
Glucose, Bld: 150 mg/dL — ABNORMAL HIGH (ref 70–99)
Glucose, Bld: 181 mg/dL — ABNORMAL HIGH (ref 70–99)
Glucose, Bld: 189 mg/dL — ABNORMAL HIGH (ref 70–99)
Glucose, Bld: 235 mg/dL — ABNORMAL HIGH (ref 70–99)
Potassium: 4.3 mmol/L (ref 3.5–5.1)
Potassium: 4.7 mmol/L (ref 3.5–5.1)
Potassium: 4.3 mmol/L (ref 3.5–5.1)
Potassium: 4.2 mmol/L (ref 3.5–5.1)
Potassium: 4.2 mmol/L (ref 3.5–5.1)
Sodium: 145 mmol/L (ref 135–145)
Sodium: 149 mmol/L — ABNORMAL HIGH (ref 135–145)
Sodium: 149 mmol/L — ABNORMAL HIGH (ref 135–145)
Sodium: 149 mmol/L — ABNORMAL HIGH (ref 135–145)
Sodium: 147 mmol/L — ABNORMAL HIGH (ref 135–145)

## 2021-10-16 LAB — ECHOCARDIOGRAM COMPLETE
AR max vel: 1.55 cm2
AV Area VTI: 1.74 cm2
AV Area mean vel: 1.59 cm2
AV Mean grad: 7.5 mmHg
AV Peak grad: 15.1 mmHg
Ao pk vel: 1.94 m/s
Area-P 1/2: 3.68 cm2
Calc EF: 72.3 %
Height: 69 in
S' Lateral: 2.64 cm
Single Plane A2C EF: 72.3 %
Single Plane A4C EF: 69.6 %
Weight: 3707.26 oz

## 2021-10-16 LAB — GLUCOSE, CAPILLARY
Glucose-Capillary: 145 mg/dL — ABNORMAL HIGH (ref 70–99)
Glucose-Capillary: 147 mg/dL — ABNORMAL HIGH (ref 70–99)
Glucose-Capillary: 154 mg/dL — ABNORMAL HIGH (ref 70–99)
Glucose-Capillary: 160 mg/dL — ABNORMAL HIGH (ref 70–99)
Glucose-Capillary: 166 mg/dL — ABNORMAL HIGH (ref 70–99)
Glucose-Capillary: 185 mg/dL — ABNORMAL HIGH (ref 70–99)
Glucose-Capillary: 191 mg/dL — ABNORMAL HIGH (ref 70–99)
Glucose-Capillary: 194 mg/dL — ABNORMAL HIGH (ref 70–99)
Glucose-Capillary: 298 mg/dL — ABNORMAL HIGH (ref 70–99)
Glucose-Capillary: 302 mg/dL — ABNORMAL HIGH (ref 70–99)
Glucose-Capillary: 388 mg/dL — ABNORMAL HIGH (ref 70–99)
Glucose-Capillary: 398 mg/dL — ABNORMAL HIGH (ref 70–99)

## 2021-10-16 LAB — TSH: TSH: 3.901 u[IU]/mL (ref 0.350–4.500)

## 2021-10-16 LAB — PHOSPHORUS
Phosphorus: 7.3 mg/dL — ABNORMAL HIGH (ref 2.5–4.6)
Phosphorus: 5.5 mg/dL — ABNORMAL HIGH (ref 2.5–4.6)

## 2021-10-16 LAB — MAGNESIUM
Magnesium: 2.9 mg/dL — ABNORMAL HIGH (ref 1.7–2.4)
Magnesium: 3.2 mg/dL — ABNORMAL HIGH (ref 1.7–2.4)

## 2021-10-16 LAB — BETA-HYDROXYBUTYRIC ACID
Beta-Hydroxybutyric Acid: 0.16 mmol/L (ref 0.05–0.27)
Beta-Hydroxybutyric Acid: 0.55 mmol/L — ABNORMAL HIGH (ref 0.05–0.27)

## 2021-10-16 LAB — CBC
HCT: 34.1 % — ABNORMAL LOW (ref 39.0–52.0)
Hemoglobin: 11.9 g/dL — ABNORMAL LOW (ref 13.0–17.0)
MCH: 31.9 pg (ref 26.0–34.0)
MCHC: 34.9 g/dL (ref 30.0–36.0)
MCV: 91.4 fL (ref 80.0–100.0)
Platelets: 140 10*3/uL — ABNORMAL LOW (ref 150–400)
RBC: 3.73 MIL/uL — ABNORMAL LOW (ref 4.22–5.81)
RDW: 11.9 % (ref 11.5–15.5)
WBC: 13.7 10*3/uL — ABNORMAL HIGH (ref 4.0–10.5)
nRBC: 0 % (ref 0.0–0.2)

## 2021-10-16 MED ORDER — INSULIN ASPART 100 UNIT/ML IJ SOLN
0.0000 [IU] | Freq: Every day | INTRAMUSCULAR | Status: DC
  Administered 2021-10-16 – 2021-10-17 (×2): 5 [IU] via SUBCUTANEOUS
  Filled 2021-10-16 (×2): qty 1

## 2021-10-16 MED ORDER — INSULIN GLARGINE-YFGN 100 UNIT/ML ~~LOC~~ SOLN
20.0000 [IU] | Freq: Two times a day (BID) | SUBCUTANEOUS | Status: DC
  Administered 2021-10-16 – 2021-10-17 (×3): 20 [IU] via SUBCUTANEOUS
  Filled 2021-10-16 (×5): qty 0.2

## 2021-10-16 MED ORDER — INSULIN GLARGINE-YFGN 100 UNIT/ML ~~LOC~~ SOLN
10.0000 [IU] | Freq: Two times a day (BID) | SUBCUTANEOUS | Status: DC
  Filled 2021-10-16: qty 0.1

## 2021-10-16 MED ORDER — INSULIN ASPART 100 UNIT/ML IJ SOLN
0.0000 [IU] | Freq: Three times a day (TID) | INTRAMUSCULAR | Status: DC
  Administered 2021-10-16 (×2): 3 [IU] via SUBCUTANEOUS
  Administered 2021-10-17 (×3): 11 [IU] via SUBCUTANEOUS
  Administered 2021-10-18: 15 [IU] via SUBCUTANEOUS
  Administered 2021-10-18: 8 [IU] via SUBCUTANEOUS
  Filled 2021-10-16 (×7): qty 1

## 2021-10-16 MED ORDER — LACTATED RINGERS IV SOLN: INTRAVENOUS | Status: DC

## 2021-10-16 MED ORDER — INSULIN ASPART 100 UNIT/ML IJ SOLN
3.0000 [IU] | Freq: Three times a day (TID) | INTRAMUSCULAR | Status: DC
  Administered 2021-10-16 – 2021-10-17 (×4): 3 [IU] via SUBCUTANEOUS
  Filled 2021-10-16 (×4): qty 1

## 2021-10-16 MED ORDER — INSULIN ASPART 100 UNIT/ML IJ SOLN: 0.0000 [IU] | Freq: Three times a day (TID) | INTRAMUSCULAR | Status: DC

## 2021-10-16 NOTE — Progress Notes (Signed)
[facility] [place], [place] 10/16/21  Subjective:   Hospital day # 1  Patient remains critically ill.  Admitted for DKA.  Able to talk some today.  States that he does not remember whether he took his insulin or not.  He has Parkinson's and states he may have forgotten to take his Parkinson's medications. Denies any pain this morning. Currently on insulin infusion and lactated Ringer.  Renal: 06/16 0701 - 06/17 0700 In: 3426.5 [I.V.:2110.1; IV Piggyback:1316.4] Out: 2600 [Urine:2600] Lab Results  Component Value Date   CREATININE 1.61 (H) 10/16/2021   CREATININE 1.88 (H) 10/16/2021   CREATININE 2.17 (H) 10/16/2021     Objective:  Vital signs in last 24 hours:  Temp:  [97.4 F (36.3 C)-98.8 F (37.1 C)] 98.6 F (37 C) (06/17 0800) Pulse Rate:  [56-108] 84 (06/17 1000) Resp:  [15-35] 15 (06/17 1000) BP: (71-154)/(49-94) 134/78 (06/17 1000) SpO2:  [92 %-100 %] 100 % (06/17 1000) Weight:  [99.8 kg-105.1 kg] 105.1 kg (06/17 0408)  Weight change:  Filed Weights   10/15/21 1312 10/15/21 1648 10/16/21 0408  Weight: 99.8 kg 101.8 kg 105.1 kg    Intake/Output:    Intake/Output Summary (Last 24 hours) at 10/16/2021 1120 Last data filed at 10/16/2021 0700 Gross per 24 hour  Intake 3426.45 ml  Output 2600 ml  Net 826.45 ml     Physical Exam: General: Ill-appearing, laying in the bed  HEENT Dry oral mucous membranes  Pulm/lungs Normal breathing effort, [place] O2  CVS/Heart Tachycardic  Abdomen:  Soft  Extremities: + Dependent edema  Neurologic: Somnolent but able to answer few simple questions  Skin: Warm, dry  Dialysis Access: None       Basic Metabolic Panel:  Recent Labs  Lab 10/15/21 1553 10/15/21 1611 10/15/21 2203 10/16/21 0143 10/16/21 0528 10/16/21 0941  NA  --  131* 140 145 149* 149*  K  --  5.2* 4.7 4.3 4.7 4.3  CL  --  88* 101 104 108 108  CO2  --  18* 30 31 33* 34*  GLUCOSE  --  1,070* 557* 313* 150* 181*  BUN  --  76* 72*  63* 62* 55*  CREATININE  --  3.03* 2.54* 2.17* 1.88* 1.61*  CALCIUM  --  7.8* 9.1 9.1 9.3 9.4  MG 2.9*  --   --   --  3.2*  --   PHOS 7.3*  --   --   --  5.5*  --      CBC: Recent Labs  Lab 10/15/21 1322 10/15/21 1553 10/16/21 0528  WBC 14.9* 10.4 13.7*  NEUTROABS 13.7*  --   --   HGB 10.7* 9.5* 11.9*  HCT 35.2* 29.9* 34.1*  MCV 104.8* 100.3* 91.4  PLT 149* 107* 140*     No results found for: "HEPBSAG", "HEPBSAB", "HEPBIGM"    Microbiology:  Recent Results (from the past 240 hour(s))  MRSA Next Gen by PCR, Nasal     Status: None   Collection Time: 10/15/21  4:50 PM   Specimen: Nasal Mucosa; Nasal Swab  Result Value Ref Range Status   MRSA by PCR Next Gen NOT DETECTED NOT DETECTED Final    Comment: (NOTE) The GeneXpert MRSA Assay (FDA approved for NASAL specimens only), is one component of a comprehensive MRSA colonization surveillance program. It is not intended to diagnose MRSA infection nor to guide or monitor treatment for MRSA infections. Test performance is not FDA approved in patients less than 45 years old.  Performed at [facility], [address] [place], [place] [address]     Coagulation Studies: No results for input(s): "LABPROT", "INR" in the last 72 hours.  Urinalysis: Recent Labs    10/15/21 1553 10/15/21 1632  COLORURINE YELLOW* YELLOW*  LABSPEC 1.021 1.010  PHURINE 5.0 5.0  GLUCOSEU >=500* NEGATIVE  HGBUR LARGE* NEGATIVE  BILIRUBINUR NEGATIVE NEGATIVE  KETONESUR 20* NEGATIVE  PROTEINUR 30* NEGATIVE  NITRITE NEGATIVE NEGATIVE  LEUKOCYTESUR TRACE* NEGATIVE      Imaging: ECHOCARDIOGRAM COMPLETE  Result Date: 10/16/2021    ECHOCARDIOGRAM REPORT   Patient Name:   Julien NordmannOtis Jackson Petitti III Date of Exam: 10/16/2021 Medical Rec #:  [id number]                 Height:       69.0 in Accession #:    [id number]                Weight:       231.7 lb Date of Birth:  [birth year]                 BSA:          2.199 m Patient Age:    61 years                   BP:           133/73 mmHg Patient Gender: M                         HR:           85 bpm. Exam Location:  [facility] Procedure: 2D Echo Indications:     Elevated Troponin  History:         Patient has prior history of Echocardiogram examinations, most                  recent 09/22/2018.  Sonographer:     Overton Mamikeshia Johnson RDCS Referring Phys:  [id number] Ezequiel EssexANA G NELSON Diagnosing Phys: Alwyn Peawayne D Callwood MD IMPRESSIONS  1. Left ventricular ejection fraction, by estimation, is 65 to 70%. The left ventricle has normal function. The left ventricle has no regional wall motion abnormalities. There is moderate concentric left ventricular hypertrophy. Left ventricular diastolic parameters are consistent with Grade I diastolic dysfunction (impaired relaxation).  2. Right ventricular systolic function is normal. The right ventricular size is normal.  3. The mitral valve is normal in structure. Trivial mitral valve regurgitation.  4. The aortic valve is normal in structure. Aortic valve regurgitation is not visualized. Aortic valve sclerosis/calcification is present, without any evidence of aortic stenosis. FINDINGS  Left Ventricle: Left ventricular ejection fraction, by estimation, is 65 to 70%. The left ventricle has normal function. The left ventricle has no regional wall motion abnormalities. The left ventricular internal cavity size was normal in size. There is  moderate concentric left ventricular hypertrophy. Left ventricular diastolic parameters are consistent with Grade I diastolic dysfunction (impaired relaxation). Right Ventricle: The right ventricular size is normal. No increase in right ventricular wall thickness. Right ventricular systolic function is normal. Left Atrium: Left atrial size was normal in size. Right Atrium: Right atrial size was normal in size. Pericardium: There is no evidence of pericardial effusion. Mitral Valve: The mitral valve is normal in structure. Trivial mitral valve regurgitation.  Tricuspid Valve: The tricuspid valve is normal in structure. Tricuspid valve regurgitation is trivial. Aortic Valve: The aortic valve is normal in structure.  Aortic valve regurgitation is not visualized. Aortic valve sclerosis/calcification is present, without any evidence of aortic stenosis. Aortic valve mean gradient measures 7.5 mmHg. Aortic valve peak  gradient measures 15.1 mmHg. Aortic valve area, by VTI measures 1.74 cm. Pulmonic Valve: The pulmonic valve was normal in structure. Pulmonic valve regurgitation is not visualized. Aorta: The ascending aorta was not well visualized. IAS/Shunts: No atrial level shunt detected by color flow Doppler.  LEFT VENTRICLE PLAX 2D LVIDd:         4.30 cm     Diastology LVIDs:         2.64 cm     LV e' medial:    5.87 cm/s LV PW:         1.44 cm     LV E/e' medial:  14.7 LV IVS:        1.65 cm     LV e' lateral:   8.05 cm/s LVOT diam:     1.90 cm     LV E/e' lateral: 10.7 LV SV:         62 LV SV Index:   28 LVOT Area:     2.84 cm  LV Volumes (MOD) LV vol d, MOD A2C: 64.6 ml LV vol d, MOD A4C: 70.4 ml LV vol s, MOD A2C: 17.9 ml LV vol s, MOD A4C: 21.4 ml LV SV MOD A2C:     46.7 ml LV SV MOD A4C:     70.4 ml LV SV MOD BP:      54.8 ml RIGHT VENTRICLE RV Basal diam:  2.99 cm RV S prime:     15.30 cm/s TAPSE (M-mode): 2.2 cm LEFT ATRIUM             Index        RIGHT ATRIUM          Index LA diam:        3.30 cm 1.50 cm/m   RA Area:     8.53 cm LA Vol (A2C):   35.2 ml 16.00 ml/m  RA Volume:   15.30 ml 6.96 ml/m LA Vol (A4C):   37.5 ml 17.05 ml/m LA Biplane Vol: 38.0 ml 17.28 ml/m  AORTIC VALVE                     PULMONIC VALVE AV Area (Vmax):    1.55 cm      PV Vmax:       1.09 m/s AV Area (Vmean):   1.59 cm      PV Peak grad:  4.8 mmHg AV Area (VTI):     1.74 cm AV Vmax:           194.00 cm/s AV Vmean:          125.500 cm/s AV VTI:            0.358 m AV Peak Grad:      15.1 mmHg AV Mean Grad:      7.5 mmHg LVOT Vmax:         106.00 cm/s LVOT Vmean:        70.600 cm/s  LVOT VTI:          0.220 m LVOT/AV VTI ratio: 0.62  AORTA Ao Root diam: 3.60 cm Ao Asc diam:  3.30 cm MITRAL VALVE               TRICUSPID VALVE MV Area (PHT): 3.68 cm    TV Peak grad:   17.0 mmHg  MV Decel Time: 206 msec    TV Vmax:        2.06 m/s MV E velocity: 86.50 cm/s MV A velocity: 96.00 cm/s  SHUNTS MV E/A ratio:  0.90        Systemic VTI:  0.22 m                            Systemic Diam: 1.90 cm Alwyn Pea MD Electronically signed by Alwyn Pea MD Signature Date/Time: 10/16/2021/11:07:36 AM    Final    CT HEAD WO CONTRAST ( )  Result Date: 10/15/2021 CLINICAL DATA:  Mental status change.  Unknown cause. EXAM: CT HEAD WITHOUT CONTRAST TECHNIQUE: Contiguous axial images were obtained from the base of the skull through the vertex without intravenous contrast. RADIATION DOSE REDUCTION: This exam was performed according to the departmental dose-optimization program which includes automated exposure control, adjustment of the mA and/or kV according to patient size and/or use of iterative reconstruction technique. COMPARISON:  CT head without contrast 03/11/2019 FINDINGS: Brain: Moderate periventricular and subcortical white matter hypoattenuation is again seen bilaterally. No acute infarct, hemorrhage, or mass lesion is present. Basal ganglia are intact. The ventricles are of normal size. No significant extraaxial fluid collection is present. The brainstem and cerebellum are within normal limits. Vascular: Atherosclerotic calcifications are present within the cavernous internal carotid arteries. No hyperdense vessel is present. Skull: Calvarium is intact. No focal lytic or blastic lesions are present. No significant extracranial soft tissue lesion is present. Sinuses/Orbits: The paranasal sinuses and mastoid air cells are clear. The globes and orbits are within normal limits. IMPRESSION: 1. Stable moderate atrophy and subcortical white matter disease. This likely reflects the sequela of  chronic microvascular ischemia. 2. No acute intracranial abnormality or significant interval change. Electronically Signed   By: Marin Roberts M.D.   On: 10/15/2021 21:05   DG Chest 1 View  Result Date: 10/15/2021 CLINICAL DATA:  Edema EXAM: CHEST  1 VIEW COMPARISON:  08/23/2018 FINDINGS: Heart size within normal limits. Aortic atherosclerosis. Low lung volumes with streaky bibasilar opacities. No pleural effusion or pneumothorax. Surgical clips at the base of the neck. IMPRESSION: Low lung volumes with streaky bibasilar opacities, likely atelectasis. Electronically Signed   By: Duanne Guess D.O.   On: 10/15/2021 14:07     Medications:    dextrose 5% lactated ringers 125 mL/hr at 10/16/21 0700   insulin 3.6 Units/hr (10/16/21 0947)   lactated ringers      bictegravir-emtricitabine-tenofovir AF  1 tablet Oral Daily   carbidopa-levodopa  1 tablet Oral TID   Chlorhexidine Gluconate Cloth  6 each Topical Q0600   insulin aspart  0-15 Units Subcutaneous TID WC   insulin aspart  0-5 Units Subcutaneous QHS   insulin aspart  3 Units Subcutaneous TID WC   insulin glargine-yfgn  20 Units Subcutaneous BID   levothyroxine  125 mcg Oral Daily   valACYclovir  500 mg Oral Daily   dextrose, docusate sodium, ondansetron (ZOFRAN) IV, mouth rinse, polyethylene glycol  Assessment/ Plan:  61 y.o. male with   type 1 diabetes, HIV, hypertension, thyroid disease, Parkinson's disease admitted on 10/15/2021 for Diabetic ketoacidosis (HCC) [E11.10] Diabetic ketoacidosis without coma associated with type 1 diabetes mellitus (HCC) [E10.10]  Diabetic ketoacidosis in the setting of type 1 diabetes Acute kidney injury Severe hyperkalemia Severe metabolic acidosis Hypernatremia  Metabolic parameters have improved significantly with IV fluids/bicarbonate supplementation.  Potassium is now in the normal  range.  Patient has however developed hypernatremia.  This should improve once patient is able to drink  water..  DKA is managed with IV insulin by primary team.  Agree with IV lactated Ringer supplementation Liberal free water intake when patient is able to drink. Supportive care.  Will follow.     LOS: 1 Haytham Maher 6/17/202311:20 AM  [facility] [place], [place] [phone]  Note: This note was prepared with Dragon dictation. Any transcription errors are unintentional

## 2021-10-16 NOTE — Progress Notes (Signed)
*  PRELIMINARY RESULTS* Echocardiogram 2D Echocardiogram has been performed.  Lenor Coffin 10/16/2021, 9:16 AM

## 2021-10-16 NOTE — Consult Note (Signed)
PHARMACY CONSULT NOTE - FOLLOW UP  Pharmacy Consult for Electrolyte Monitoring and Replacement   Recent Labs: Potassium (mmol/L)  Date Value  10/16/2021 4.7   Magnesium (mg/dL)  Date Value  81/82/9937 3.2 (H)   Calcium (mg/dL)  Date Value  16/96/7893 9.3   Albumin (g/dL)  Date Value  81/05/7508 3.7   Phosphorus (mg/dL)  Date Value  25/85/2778 5.5 (H)   Sodium (mmol/L)  Date Value  10/16/2021 149 (H)     Assessment: 61 yo male with a PMH of Uncontrolled Type I Diabetes Mellitus who presented to [facility] ER via EMS from home on 06/16 with altered mental status. ON insulin gtt transitioning to SQ insulin.   Goal of Therapy:  WNL  Plan:  No replacement needed at this time.   Ronnald Ramp ,PharmD Clinical Pharmacist 10/16/2021 8:38 AM

## 2021-10-16 NOTE — Inpatient Diabetes Management (Signed)
Inpatient Diabetes Program Recommendations  AACE/ADA: New Consensus Statement on Inpatient Glycemic Control (2015)  Target Ranges:  Prepandial:   less than 140 mg/dL      Peak postprandial:   less than 180 mg/dL (1-2 hours)      Critically ill patients:  140 - 180 mg/dL   Lab Results  Component Value Date   GLUCAP 160 (H) 10/16/2021   HGBA1C 11.2 (H) 03/11/2019    Review of Glycemic Control  Diabetes history: DM1 Outpatient Diabetes medications: Levemir 25 units bid, Novolog 20 units bid Current orders for Inpatient glycemic control: Transitioning to Semglee 10 units bid, Novolog 3 units tid meal coverage, Novolog 0-15 units tid  Inpatient Diabetes Program Recommendations:   Please consider: -Increase Semglee to 20 units bid (80% home basal dose) -Add Novolog 0-5 units correction hs  Thank you, Darel Hong E. Etta Gassett, RN, MSN, CDE  Diabetes Coordinator Inpatient Glycemic Control Team Team Pager [phone] (8am-5pm) 10/16/2021 8:21 AM

## 2021-10-16 NOTE — H&P (Signed)
NAME:  Jeffrey Pearson, MRN:  [id number], DOB:  [birth year], LOS: 1 ADMISSION DATE:  10/15/2021, CONSULTATION DATE: 10/15/2021 REFERRING MD: Dr. Joni Fears, CHIEF COMPLAINT: Altered Mental Status    History of Present Illness:  This is a 62 yo male with a PMH of Uncontrolled Type I Diabetes Mellitus who presented to [facility] ER via EMS from home on 06/16 with altered mental status.  According to the pt and pts family at bedside the pt has been doing intermittent fasting over the past 2 weeks and not taking his medications.  He has also had bilateral lower extremity swelling along with worsening tremors and difficulty walking due to his noncompliance with his Parkinson's Disease medication.  ED Course  Upon arrival to the ER lab results revealed Na+ 122, K+ >7.5, chloride 83, CO2 11, glucose >1,200, BUN 92, creatinine 3.45, calcium 8.6, anion gap 28, ASt 134, alt 48, BNP 152.7, troponin 542, lactic acid 4.6, wbc 14.9, hgb 10.7, and platelets 149.  Pt ruled in for DKA and insulin gtt initiated.  CXR concerning for atelectasis.  Initial EKG revealed wide QRS complex, peaked T waves, with slight ST elevation in V1 and V2 concerning for STEMI.  ER provider notified Cardiologist Dr. Fletcher Anon who concluded EKG changes likely due to electrolyte abnormality, and pt deemed not a candidate for urgent cardiac catheterization, but recommended continued resuscitation.  Nephrology also consulted due to severe hyperkalemia concluded electrolyte abnormality due to DKA, and recommended initiation of isotonic bicarb infusion along with volume resuscitation.  PCCM team contacted for ICU admission.    Pertinent  Medical History  HIV (managed by [facility] ID) Uncontrolled Type I Diabetes Mellitus  Hepatitis B Tinea Pedis Syphilis  Erectile Dysfunction  HTN  Hypothyroidism s/p Subtotal Thyroidectomy  Depression  HLD  Significant Hospital Events: Including procedures, antibiotic start and stop dates in addition to other  pertinent events   06/16: Pt admitted to ICU with severe DKA, acute kidney injury with hyperkalemia, anion gap metabolic and lactic acidosis, acute metabolic encephalopathy, and elevated troponin requiring insulin gtt  6/17: Patient was confused overnight he is feeling better now we will start p.o. intake once the long-acting insulin came up from pharmacy we will change it to subcu and stop the drip  Interim History / Subjective:  Pt with severe tremors secondary to parkinson's disease and confused to situation along with time.  Vital signs stable.  Insulin and sodium bicarb gtts infusing  Confused overnight but back to baseline this morning back to get off insulin drip  Objective   Blood pressure 133/73, pulse 87, temperature 98.4 F (36.9 C), temperature source Axillary, resp. rate 20, height 5\' 9"  (1.753 m), weight 105.1 kg, SpO2 93 %.        Intake/Output Summary (Last 24 hours) at 10/16/2021 0830 Last data filed at 10/16/2021 0700 Gross per 24 hour  Intake 3426.45 ml  Output 2600 ml  Net 826.45 ml   Filed Weights   10/15/21 1312 10/15/21 1648 10/16/21 0408  Weight: 99.8 kg 101.8 kg 105.1 kg    Examination: General: Acute on chronically ill appearing male, tremors are improving HENT: Supple, no JVD  Lungs: Faint rhonchi bilateral upper lobes/clear all other lobes, even, non labored  Cardiovascular: Sinus tach, s1s2, no r/g, 2+ radial/1+ distal pulses, 2+ left lower extremity pitting edema/3+ right lower extremity pitting edema  Abdomen: +BS x4, obese, soft, non tender, non distended  Extremities: Bilateral foot drop, moves all extremities  Neuro: Alert to self only,  follows commands, PERRLA  GU: Deferred   Resolved Hospital Problem list     Assessment & Plan:  Severe Diabetes ketoacidosis secondary to medication noncompliance  Hx: Type 1 diabetes mellitus  - Continue insulin gtt until anion gap closed and serum CO2 20 or higher - BMP q4hrs and beta-hydroxybutyric acid  q8hrs while on insulin gtt - IV fluids per DKA protocol  - Hemoglobin A1c pending  - Diabetes coordinator consulted appreciate input   Elevated troponin secondary to demand ischemia in setting of DKA vs. 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 bicuspid    (congenitally malformed) with moderately thickened leaflets    with mildly reduced excursion Hx: HTN and HLD  - Continuous telemetry monitoring  - Trend troponin's until peaked  - Will hold outpatient atorvastatin for now due to elevated liver enzymes - Continue outpatient aspirin  - Echo pending  - Cardiology consulted by EDP pt not candidate for urgent cardiac catheterization   Acute kidney injury with anion gap metabolic acidosis and hyperkalemia and hyponatremia in the setting of DKA  Lactic acidosis  - Trend BMP and lactic acid  - CK pending  - Replace electrolytes as indicated  - Monitor UOP - Avoid nephrotoxic medications  - Per nephrology recommendations continue sodium bicarb gtt for now  HIV -Continue outpatient biktarvy   Elevated liver enzymes  - Trend hepatic function panel   Anemia and thrombocytopenia without obvious acute blood loss - Trend CBC  - Monitor for s/sx of bleeding and transfuse for hgb <7  Hypothyroidism s/p subtotal thyroidectomy  -TSH and free T3 results pending  - Resume outpatient synthroid  Parkinson's Disease - Resume outpatient sinemet-ir   Acute metabolic encephalopathy secondary to DKA  - Avoid sedating medications  - Supportive care   Best Practice (right click and "Reselect all SmartList Selections" daily)   Diet/type: NPO w/ oral meds DVT prophylaxis: prophylactic heparin  GI prophylaxis: N/A Lines: N/A Foley:  N/A Code Status:  full code Last date of multidisciplinary goals of care discussion [N/A]  Updated pts brother and nephew at bedside regarding pts condition and current plan of care   Labs   CBC: Recent Labs  Lab 10/15/21 1322 10/15/21 1553 10/16/21 0528  WBC 14.9* 10.4 13.7*  NEUTROABS 13.7*  --   --   HGB 10.7* 9.5* 11.9*  HCT 35.2* 29.9* 34.1*  MCV 104.8* 100.3* 91.4  PLT 149* 107* 140*     Basic Metabolic Panel: Recent Labs  Lab 10/15/21 1322 10/15/21 1553 10/15/21 1611 10/15/21 2203 10/16/21 0143 10/16/21 0528  NA 122*  --  131* 140 145 149*  K >7.5*  --  5.2* 4.7 4.3 4.7  CL 83*  --  88* 101 104 108  CO2 11*  --  18* 30 31 33*  GLUCOSE >1,200*  --  1,070* 557* 313* 150*  BUN 92*  --  76* 72* 63* 62*  CREATININE 3.45*  --  3.03* 2.54* 2.17* 1.88*  CALCIUM 8.6*  --  7.8* 9.1 9.1 9.3  MG  --  2.9*  --   --   --  3.2*  PHOS  --  7.3*  --   --   --  5.5*    GFR: Estimated Creatinine Clearance: 49.3 mL/min (A) (by C-G formula based on SCr of 1.88 mg/dL (H)). Recent Labs  Lab 10/15/21 1322 10/15/21 1553 10/16/21 0528  WBC 14.9* 10.4 13.7*  LATICACIDVEN  4.6* 3.9*  --      Liver Function Tests: Recent Labs  Lab 10/15/21 1322  AST 134*  ALT 48*  ALKPHOS 117  BILITOT 1.7*  PROT 7.0  ALBUMIN 3.7    No results for input(s): "LIPASE", "AMYLASE" in the last 168 hours. No results for input(s): "AMMONIA" in the last 168 hours.  ABG    Component Value Date/Time   HCO3 29.1 (H) 03/11/2019 1847   O2SAT 76.9 03/11/2019 1847     Coagulation Profile: No results for input(s): "INR", "PROTIME" in the last 168 hours.  Cardiac Enzymes: Recent Labs  Lab 10/15/21 1553  CKTOTAL 9,071*    HbA1C: Hgb A1c MFr Bld  Date/Time Value Ref Range Status  03/11/2019 10:32 PM 11.2 (H) 4.8 - 5.6 % Final    Comment:    (NOTE) Pre diabetes:          5.7%-6.4% Diabetes:              >6.4% Glycemic control for   <7.0% adults with diabetes   09/21/2018 06:11 AM 11.8 (H) 4.8 - 5.6 % Final    Comment:    (NOTE) Pre diabetes:          5.7%-6.4% Diabetes:              >6.4% Glycemic control for   <7.0% adults with diabetes     CBG: Recent  Labs  Lab 10/16/21 0318 10/16/21 0404 10/16/21 0506 10/16/21 0601 10/16/21 0749  GLUCAP 185* 154* 145* 147* 160*     Review of Systems:   Gen: Denies fever, chills, weight change, fatigue, night sweats HEENT: Denies blurred vision, double vision, hearing loss, tinnitus, sinus congestion, rhinorrhea, sore throat, neck stiffness, dysphagia PULM: Denies shortness of breath, cough, sputum production, hemoptysis, wheezing CV: chest pain, edema, orthopnea, paroxysmal nocturnal dyspnea, palpitations GI: Denies abdominal pain, nausea, vomiting, diarrhea, hematochezia, melena, constipation, change in bowel habits GU: Denies dysuria, hematuria, polyuria, oliguria, urethral discharge Endocrine: Denies hot or cold intolerance, polyuria, polyphagia or appetite change Derm: Denies rash, dry skin, scaling or peeling skin change Heme: Denies easy bruising, bleeding, bleeding gums Neuro: confusion,headache, numbness, weakness, slurred speech, loss of memory or consciousness   Past Medical History:  He,  has a past medical history of Depression, Diabetes mellitus without complication (HCC), HIV (human immunodeficiency virus infection) (HCC), Hypertension, Hyperthyroidism, and Thyroid disease.   Surgical History:   Past Surgical History:  Procedure Laterality Date   lymph nodes  1982   THYROIDECTOMY     THYROIDECTOMY, PARTIAL  1980   TONSILLECTOMY       Social History:   reports that he has never smoked. He has never used smokeless tobacco. He reports that he does not drink alcohol and does not use drugs.   Family History:  His family history includes Dementia in his mother; Prostate cancer in his father.   Allergies Allergies  Allergen Reactions   Septra [Sulfamethoxazole-Trimethoprim] Rash     Home Medications  Prior to Admission medications   Medication Sig Start Date End Date Taking? Authorizing Provider  atorvastatin (LIPITOR) 40 MG tablet Take 1 tablet (40 mg total) by mouth  daily at 6 PM. 09/22/18  Yes Altamese Dilling, MD  bictegravir-emtricitabine-tenofovir AF (BIKTARVY) 50-200-25 MG TABS tablet Take 1 tablet by mouth daily. 07/16/18  Yes [provider]  carbidopa-levodopa (SINEMET IR) 25-100 MG tablet Take 1 tablet by mouth 3 (three) times daily. 08/24/21  Yes [provider]  citalopram (CELEXA) 40 MG  tablet Take 40 mg by mouth daily. 02/01/19  Yes [provider]  insulin aspart (NOVOLOG FLEXPEN) 100 UNIT/ML FlexPen Inject 20 Units into the skin 3 (three) times daily before meals.    Yes [provider]  LEVEMIR FLEXTOUCH 100 UNIT/ML Pen Inject 25 Units into the skin 2 (two) times daily. 02/07/19  Yes [provider]  levothyroxine (SYNTHROID) 125 MCG tablet Take 125 mcg by mouth daily. 01/22/19  Yes [provider]  lisinopril (ZESTRIL) 20 MG tablet Take 20 mg by mouth daily. 02/01/19  Yes [provider]  traZODone (DESYREL) 100 MG tablet Take 200 mg by mouth Nightly. 06/04/18  Yes [provider]  valACYclovir (VALTREX) 500 MG tablet Take 500 mg by mouth daily. 02/07/19  Yes [provider]  aspirin 325 MG tablet Take 325 mg by mouth daily. 01/03/19   [provider]  clindamycin (CLEOCIN) 150 MG capsule Take 2 capsules (300 mg total) by mouth 3 (three) times daily. Patient not taking: Reported on 10/15/2021 08/03/19   Versie Starks, PA-C  HYDROcodone-acetaminophen (NORCO/VICODIN) 5-325 MG tablet Take 1 tablet by mouth every 6 (six) hours as needed for moderate pain. Patient not taking: Reported on 10/15/2021 08/03/19   Versie Starks, PA-C

## 2021-10-17 DIAGNOSIS — M6282 Rhabdomyolysis: Secondary | ICD-10-CM | POA: Diagnosis present

## 2021-10-17 DIAGNOSIS — G9340 Encephalopathy, unspecified: Secondary | ICD-10-CM | POA: Diagnosis not present

## 2021-10-17 DIAGNOSIS — I5032 Chronic diastolic (congestive) heart failure: Secondary | ICD-10-CM | POA: Diagnosis present

## 2021-10-17 DIAGNOSIS — G20 Parkinson's disease: Secondary | ICD-10-CM

## 2021-10-17 DIAGNOSIS — B20 Human immunodeficiency virus [HIV] disease: Secondary | ICD-10-CM

## 2021-10-17 DIAGNOSIS — E870 Hyperosmolality and hypernatremia: Secondary | ICD-10-CM | POA: Diagnosis not present

## 2021-10-17 DIAGNOSIS — E1010 Type 1 diabetes mellitus with ketoacidosis without coma: Secondary | ICD-10-CM | POA: Diagnosis not present

## 2021-10-17 DIAGNOSIS — E039 Hypothyroidism, unspecified: Secondary | ICD-10-CM | POA: Diagnosis present

## 2021-10-17 DIAGNOSIS — E669 Obesity, unspecified: Secondary | ICD-10-CM | POA: Diagnosis present

## 2021-10-17 DIAGNOSIS — E875 Hyperkalemia: Secondary | ICD-10-CM | POA: Diagnosis present

## 2021-10-17 LAB — CBC WITH DIFFERENTIAL/PLATELET
Abs Immature Granulocytes: 0.03 10*3/uL (ref 0.00–0.07)
Basophils Absolute: 0 10*3/uL (ref 0.0–0.1)
Basophils Relative: 0 %
Eosinophils Absolute: 0 10*3/uL (ref 0.0–0.5)
Eosinophils Relative: 1 %
HCT: 35.8 % — ABNORMAL LOW (ref 39.0–52.0)
Hemoglobin: 11.9 g/dL — ABNORMAL LOW (ref 13.0–17.0)
Immature Granulocytes: 1 %
Lymphocytes Relative: 17 %
Lymphs Abs: 1 10*3/uL (ref 0.7–4.0)
MCH: 31.3 pg (ref 26.0–34.0)
MCHC: 33.2 g/dL (ref 30.0–36.0)
MCV: 94.2 fL (ref 80.0–100.0)
Monocytes Absolute: 0.4 10*3/uL (ref 0.1–1.0)
Monocytes Relative: 6 %
Neutro Abs: 4.5 10*3/uL (ref 1.7–7.7)
Neutrophils Relative %: 75 %
Platelets: 104 10*3/uL — ABNORMAL LOW (ref 150–400)
RBC: 3.8 MIL/uL — ABNORMAL LOW (ref 4.22–5.81)
RDW: 11.9 % (ref 11.5–15.5)
WBC: 5.9 10*3/uL (ref 4.0–10.5)
nRBC: 0 % (ref 0.0–0.2)

## 2021-10-17 LAB — COMPREHENSIVE METABOLIC PANEL
ALT: 38 U/L (ref 0–44)
AST: 155 U/L — ABNORMAL HIGH (ref 15–41)
Albumin: 3.3 g/dL — ABNORMAL LOW (ref 3.5–5.0)
Alkaline Phosphatase: 102 U/L (ref 38–126)
Anion gap: 7 (ref 5–15)
BUN: 30 mg/dL — ABNORMAL HIGH (ref 8–23)
CO2: 32 mmol/L (ref 22–32)
Calcium: 8.9 mg/dL (ref 8.9–10.3)
Chloride: 103 mmol/L (ref 98–111)
Creatinine, Ser: 1.1 mg/dL (ref 0.61–1.24)
GFR, Estimated: >60 mL/min (ref >60)
Glucose, Bld: 290 mg/dL — ABNORMAL HIGH (ref 70–99)
Potassium: 4.6 mmol/L (ref 3.5–5.1)
Sodium: 142 mmol/L (ref 135–145)
Total Bilirubin: 1.2 mg/dL (ref 0.3–1.2)
Total Protein: 6.6 g/dL (ref 6.5–8.1)

## 2021-10-17 LAB — GLUCOSE, CAPILLARY
Glucose-Capillary: 302 mg/dL — ABNORMAL HIGH (ref 70–99)
Glucose-Capillary: 382 mg/dL — ABNORMAL HIGH (ref 70–99)
Glucose-Capillary: 320 mg/dL — ABNORMAL HIGH (ref 70–99)
Glucose-Capillary: 305 mg/dL — ABNORMAL HIGH (ref 70–99)
Glucose-Capillary: 351 mg/dL — ABNORMAL HIGH (ref 70–99)

## 2021-10-17 LAB — HEMOGLOBIN A1C
Hgb A1c MFr Bld: 11.3 % — ABNORMAL HIGH (ref 4.8–5.6)
Mean Plasma Glucose: 278 mg/dL

## 2021-10-17 LAB — PHOSPHORUS: Phosphorus: 2.9 mg/dL (ref 2.5–4.6)

## 2021-10-17 LAB — T3, FREE: T3, Free: 1.3 pg/mL — ABNORMAL LOW (ref 2.0–4.4)

## 2021-10-17 LAB — CK: Total CK: 2063 U/L — ABNORMAL HIGH (ref 49–397)

## 2021-10-17 LAB — MAGNESIUM: Magnesium: 2.4 mg/dL (ref 1.7–2.4)

## 2021-10-17 MED ORDER — INSULIN ASPART 100 UNIT/ML IJ SOLN
5.0000 [IU] | Freq: Three times a day (TID) | INTRAMUSCULAR | Status: DC
  Administered 2021-10-17 – 2021-10-18 (×3): 5 [IU] via SUBCUTANEOUS
  Filled 2021-10-17 (×2): qty 1

## 2021-10-17 MED ORDER — FUROSEMIDE 10 MG/ML IJ SOLN
20.0000 mg | Freq: Two times a day (BID) | INTRAMUSCULAR | Status: DC
  Administered 2021-10-17 – 2021-10-18 (×2): 20 mg via INTRAVENOUS
  Filled 2021-10-17 (×2): qty 2

## 2021-10-17 MED ORDER — INSULIN GLARGINE-YFGN 100 UNIT/ML ~~LOC~~ SOLN
25.0000 [IU] | Freq: Two times a day (BID) | SUBCUTANEOUS | Status: DC
  Administered 2021-10-17 – 2021-10-18 (×2): 25 [IU] via SUBCUTANEOUS
  Filled 2021-10-17 (×3): qty 0.25

## 2021-10-17 NOTE — Inpatient Diabetes Management (Signed)
Inpatient Diabetes Program Recommendations  AACE/ADA: New Consensus Statement on Inpatient Glycemic Control (2015)  Target Ranges:  Prepandial:   less than 140 mg/dL      Peak postprandial:   less than 180 mg/dL (1-2 hours)      Critically ill patients:  140 - 180 mg/dL   Lab Results  Component Value Date   GLUCAP 382 (H) 10/17/2021   HGBA1C 11.3 (H) 10/15/2021    Review of Glycemic Control  Latest Reference Range & Units 10/16/21 12:00 10/16/21 16:10 10/16/21 22:26 10/17/21 07:46 10/17/21 11:23  Glucose-Capillary 70 - 99 mg/dL 333 (H) 545 (H) 625 (H) 302 (H) 382 (H)  (H): Data is abnormally high  Diabetes history: DM1 Outpatient Diabetes medications: Levemir 25 units bid, Novolog 20 units bid Current orders for Inpatient glycemic control: Transitioning to Semglee 20 units bid, Novolog 3 units tid meal coverage, Novolog 0-15 units tid  Inpatient Diabetes Program Recommendations:   Please consider: -Increase Semglee to 25 units bid -Increase Novolog to 5 units tid if eats 50%  Thank you, Darel Hong E. Jaxyn Rout, RN, MSN, CDE  Diabetes Coordinator Inpatient Glycemic Control Team Team Pager [phone] (8am-5pm) 10/17/2021 12:21 PM

## 2021-10-17 NOTE — Assessment & Plan Note (Signed)
Continue Synthroid °

## 2021-10-17 NOTE — Assessment & Plan Note (Signed)
Continue retrovirals

## 2021-10-17 NOTE — Assessment & Plan Note (Signed)
Resolved, secondary to stopping his medications and starvation ketosis.  CBGs more stable and have been titrating up his Lantus and sliding scale.  Appreciate diabetic coordinator help.

## 2021-10-17 NOTE — Progress Notes (Signed)
Serum creatinine is now in the normal range and potassium and sodium are also in normal range. We will sign off. Please reconsult as necessary

## 2021-10-17 NOTE — Hospital Course (Signed)
61 year old male with past medical history of diabetes mellitus type 1, Parkinson's disease hepatitis B, hypertension and hypothyroidism presented to the emergency room on 6/16 with confusion.  Reportedly, the patient had not been taking his medications for the past 2 weeks and has been doing intermittent fasting.  In the emergency room, patient was found to have a CBG greater than 1200 and in DKA.  Also noted to have potassium of greater than 7.5 and an EKG noting wide QRS and peaked T waves plus slight ST elevation in leads V1 and V2.  Patient was admitted to the ICU to the critical care service and started on an insulin drip plus IV fluids for his DKA.  Cardiology was consulted and it was felt that EKG changes were related more to electrolyte abnormalities.  Nephrology consulted for severe hyperkalemia and recommended initiation of isotonic bicarbonate infusion.  By 6/17, patient still confused however vital signs were stable and labs improved to the point where patient could get off of insulin drip.  Potassium had normalized.  Patient transferred to hospitalist service starting 6/18.

## 2021-10-17 NOTE — Progress Notes (Signed)
[facility] Progress Note  Patient: Jeffrey Pearson    TMH:[id number]  DOA: 10/15/2021    Date of Service: the patient was seen and examined on 10/17/2021  Brief hospital course: 61 year old male with past medical history of diabetes mellitus type 1, Parkinson's disease hepatitis B, hypertension and hypothyroidism presented to the emergency room on 6/16 with confusion.  Reportedly, the patient had not been taking his medications for the past 2 weeks and has been doing intermittent fasting.  In the emergency room, patient was found to have a CBG greater than 1200 and in DKA.  Also noted to have potassium of greater than 7.5 and an EKG noting wide QRS and peaked T waves plus slight ST elevation in leads V1 and V2.  Patient was admitted to the ICU to the critical care service and started on an insulin drip plus IV fluids for his DKA.  Cardiology was consulted and it was felt that EKG changes were related more to electrolyte abnormalities.  Nephrology consulted for severe hyperkalemia and recommended initiation of isotonic bicarbonate infusion.  By 6/17, patient still confused however vital signs were stable and labs improved to the point where patient could get off of insulin drip.  Potassium had normalized.  Patient transferred to hospitalist service starting 6/18.  Assessment and Plan: Assessment and Plan: * Diabetic ketoacidosis (HCC)-resolved as of 10/17/2021 Resolved, secondary to stopping his medications and starvation ketosis.  CBGs more stable and have been titrating up his Lantus and sliding scale.  Appreciate diabetic coordinator help.  Acute encephalopathy Secondary to DKA.  Resolved  Hyperkalemia-resolved as of 10/17/2021 Secondary to electrolyte imbalances from DKA.  Appreciate nephrology help.  Received bicarb infusion.  Resolved.  Hypernatremia-resolved as of 10/17/2021 Secondary severe dehydration brought on by DKA.  Initially patient sodium was normal on admission  however he should have been pseudohyponatremia.  If 1 accounts for elevated CBGs and adjustment in sodium, patient's actual sodium on admission was greater than 150.  Now resolved with treatment of DKA and IV fluids.  Parkinson's disease (HCC) Continue Sinemet  Hypothyroidism Continue Synthroid  HIV (human immunodeficiency virus infection) (HCC) Continue retrovirals  Obesity (BMI 30-39.9) Meets criteria for BMI greater than 30       Body mass index is 34.22 kg/m.        Consultants: Critical care  Procedures: None  Antimicrobials: None  Code Status: Full code   Subjective: Patient doing okay.  No complaints.  Objective: Vital signs were reviewed and unremarkable. Vitals:   10/17/21 1000 10/17/21 1319  BP: (!) 148/74 (!) 153/90  Pulse: 89 85  Resp: 16 (!) 22  Temp:    SpO2: 99% 92%    Intake/Output Summary (Last 24 hours) at 10/17/2021 1422 Last data filed at 10/17/2021 1040 Gross per 24 hour  Intake 1325.6 ml  Output 3325 ml  Net -1999.4 ml   Filed Weights   10/15/21 1648 10/16/21 0408 10/17/21 0410  Weight: 101.8 kg 105.1 kg 105.1 kg   Body mass index is 34.22 kg/m.  Exam:  General: Alert and oriented x3, no acute distress HEENT: Normocephalic and atraumatic, mucous membranes are moist Cardiovascular: Regular rate and rhythm, S1-S2 Respiratory: Clear to auscultation bilaterally Abdomen: Soft, nontender, nondistended, positive bowel sounds Musculoskeletal: No clubbing or cyanosis or edema Skin: No skin breaks, tears or lesions Psychiatry: Appropriate, no evidence of psychoses Neurology: No focal deficits  Data Reviewed: Today's labs are stable except for elevated CBGs in the 300s  Disposition:  Status is:  Inpatient Remains inpatient appropriate because: Need for placement.  Further stabilization of CBGs    Anticipated discharge date: 6/20  Family Communication: Left message for family DVT Prophylaxis: SCDs Start: 10/15/21  1550    Author: Hollice Espy ,MD 10/17/2021 2:22 PM  To reach On-call, see care teams to locate the attending and reach out via [email or web address]. Between 7PM-7AM, please contact night-coverage If you still have difficulty reaching the attending provider, please page the [facility] (Director on Call) for [facility] on amion for assistance.

## 2021-10-17 NOTE — Assessment & Plan Note (Signed)
Secondary severe dehydration brought on by DKA.  Initially patient sodium was normal on admission however he should have been pseudohyponatremia.  If 1 accounts for elevated CBGs and adjustment in sodium, patient's actual sodium on admission was greater than 150.  Now resolved with treatment of DKA and IV fluids.

## 2021-10-17 NOTE — Evaluation (Signed)
Physical Therapy Evaluation Patient Details Name: Jeffrey Pearson MRN: [id number] DOB: 1961/01/16 Today's Date: 10/17/2021  History of Present Illness  Pt is a 61 y/o M admitted on 10/15/21 after presenting to the hospital with AMS. According to the pt & family pt was doing intermittent fasting x 2 weeks & not taking medications. Pt also had BLE edema, worsening tremors & diffculty walking 2/2 noncompliance with PD medication. Pt was admitted for tx of severe DKA, AKI with hyperkalemia, acute metabolic encephalopathy & elevated troponin. PMH: DM1, Hep B, HIV, syphilis, HTN, hypothyroidism s/p subtotal thyroidectomy, depression, HLD, Parkinson's Disease  Clinical Impression  Pt seen for PT evaluation with pt reporting prior to admission he was independent & working at [employer]. On this date, pt requires min assist for gait without AD with impaired gait pattern as noted below & 1 LOB requiring min/mod assist to correct. Provided pt with RW & pt with improving balance, requiring only light min assist for gait. Educated pt on recommendation of assistance at d/c with pt reporting his nephew & brother can both provide heavy assistance at d/c. Will continue to follow pt acutely to address balance, safety, and gait with LRAD.   Pt on room air throughout session with SPO2 reading as low as 80% but question accuracy of pleth waveform & pt denying feeling SOB, otherwise SPO2 >90%.   Recommendations for follow up therapy are one component of a multi-disciplinary discharge planning process, led by the attending physician.  Recommendations may be updated based on patient status, additional functional criteria and insurance authorization.  Follow Up Recommendations Home health PT    Assistance Recommended at Discharge Frequent or constant Supervision/Assistance  Patient can return home with the following  A little help with walking and/or transfers;A little help with  bathing/dressing/bathroom;Assistance with cooking/housework;Assist for transportation;Help with stairs or ramp for entrance    Equipment Recommendations Rolling walker (2 wheels)  Recommendations for Other Services       Functional Status Assessment Patient has had a recent decline in their functional status and demonstrates the ability to make significant improvements in function in a reasonable and predictable amount of time.     Precautions / Restrictions Precautions Precautions: Fall Restrictions Weight Bearing Restrictions: No      Mobility  Bed Mobility Overal bed mobility: Needs Assistance Bed Mobility: Supine to Sit, Sit to Supine     Supine to sit: Supervision, HOB elevated Sit to supine: Supervision, HOB elevated   General bed mobility comments: extra time to transfer BLE onto bed    Transfers Overall transfer level: Needs assistance Equipment used: None, Rolling walker (2 wheels) Transfers: Sit to/from Stand Sit to Stand: Min assist           General transfer comment: cuing for safe hand placement when using RW    Ambulation/Gait Ambulation/Gait assistance: Min assist Gait Distance (Feet): 70 Feet (+ 70 ft) Assistive device: None, Rolling walker (2 wheels) Gait Pattern/deviations: Decreased step length - right, Decreased step length - left, Decreased dorsiflexion - right, Decreased dorsiflexion - left, Decreased stride length Gait velocity: decreased     General Gait Details: impaired balance without AD & 1 LOB with min/mod assist to recover; provided pt with RW & pt ambulates with light min assist with improved balance & cuing to ambulate within base of AD; decreased dorsiflexion & heel strike BLE (L>R)  Stairs            Wheelchair Mobility    Modified Rankin (Stroke  Patients Only)       Balance Overall balance assessment: Needs assistance Sitting-balance support: Feet supported, Bilateral upper extremity supported Sitting  balance-Leahy Scale: Fair Sitting balance - Comments: supervision static sitting   Standing balance support: During functional activity, No upper extremity supported Standing balance-Leahy Scale: Poor                               Pertinent Vitals/Pain Pain Assessment Pain Assessment: No/denies pain    Home Living Family/patient expects to be discharged to:: Private residence Living Arrangements: Other relatives (nephew) Available Help at Discharge: Family;Available PRN/intermittently (reports his nephew & brother are both teachers out for the summer & can provide heavy assistance upon d/c) Type of Home: House Home Access: Stairs to enter Entrance Stairs-Rails: None Entrance Stairs-Number of Steps: 2   Home Layout: One level Home Equipment: None      Prior Function Prior Level of Function : Independent/Modified Independent;Working/employed;Driving             Mobility Comments: Working at [employer] ADLs Comments: IND in ADL/IADL. Works ~30 hrs./wk.     Hand Dominance   Dominant Hand: Right    Extremity/Trunk Assessment   Upper Extremity Assessment Upper Extremity Assessment: Generalized weakness    Lower Extremity Assessment Lower Extremity Assessment: Generalized weakness       Communication   Communication: No difficulties  Cognition Arousal/Alertness: Awake/alert Behavior During Therapy: WFL for tasks assessed/performed Overall Cognitive Status: Within Functional Limits for tasks assessed                                 General Comments: 1 instance of extra time & cuing to follow commands to turn around in hallway otherwise WNL        General Comments      Exercises     Assessment/Plan    PT Assessment Patient needs continued PT services  PT Problem List Decreased strength;Decreased coordination;Cardiopulmonary status limiting activity;Decreased range of motion;Decreased activity tolerance;Decreased knowledge of  use of DME;Decreased balance;Decreased mobility;Decreased knowledge of precautions       PT Treatment Interventions DME instruction;Therapeutic exercise;Gait training;Balance training;Stair training;Neuromuscular re-education;Functional mobility training;Therapeutic activities;Patient/family education    PT Goals (Current goals can be found in the Care Plan section)  Acute Rehab PT Goals Patient Stated Goal: get better PT Goal Formulation: With patient Time For Goal Achievement: 10/31/21 Potential to Achieve Goals: Good    Frequency Min 2X/week     Co-evaluation               AM-PAC PT "6 Clicks" Mobility  Outcome Measure Help needed turning from your back to your side while in a flat bed without using bedrails?: None Help needed moving from lying on your back to sitting on the side of a flat bed without using bedrails?: None Help needed moving to and from a bed to a chair (including a wheelchair)?: A Little Help needed standing up from a chair using your arms (e.g., wheelchair or bedside chair)?: A Little Help needed to walk in hospital room?: A Little Help needed climbing 3-5 steps with a railing? : A Little 6 Click Score: 20    End of Session Equipment Utilized During Treatment: Gait belt Activity Tolerance: Patient tolerated treatment well Patient left: in bed;with call bell/phone within reach;with bed alarm set Nurse Communication: Mobility status (O2) PT Visit Diagnosis: Unsteadiness  on feet (R26.81);Muscle weakness (generalized) (M62.81);Difficulty in walking, not elsewhere classified (R26.2)    Time: 9767-3419 PT Time Calculation (min) (ACUTE ONLY): 22 min   Charges:   PT Evaluation $PT Eval Moderate Complexity: 1 Mod PT Treatments $Therapeutic Activity: 8-22 mins        Aleda Grana, PT, DPT 10/17/21, 2:45 PM   Sandi Mariscal 10/17/2021, 2:44 PM

## 2021-10-17 NOTE — Assessment & Plan Note (Signed)
Secondary to DKA.  Resolved. 

## 2021-10-17 NOTE — Assessment & Plan Note (Signed)
Continue Sinemet ?

## 2021-10-17 NOTE — Evaluation (Signed)
Occupational Therapy Evaluation Patient Details Name: Jeffrey Pearson MRN: [id number] DOB: [birth year] Today's Date: 10/17/2021   History of Present Illness Pt is a 61 y/o M admitted on 10/15/21 after presenting to the hospital with AMS. According to the pt & family pt was doing intermittent fasting x 2 weeks & not taking medications. Pt also had BLE edema, worsening tremors & diffculty walking 2/2 noncompliance with PD medication. Pt was admitted for tx of severe DKA, AKI with hyperkalemia, acute metabolic encephalopathy & elevated troponin. PMH: DM1, Hep B, HIV, syphilis, HTN, hypothyroidism s/p subtotal thyroidectomy, depression, HLD, Parkinson's Disease   Clinical Impression   Jeffrey Pearson presents with generalized weakness, fatigue, and impaired balance. Pt lives alone in a 1-story home, works 30 hrs/week at [employer], lives with 2 large dogs that he frequently takes to the dog park, drives, and has been IND in ADL and IADL. Jeffrey Pearson reports that he generally takes good care of his health, adheres well to his extensive medication routine, and has close relationships with infectious disease, endocrinology, and neurology services at [facility]. He is frequently tearful during today's session and reports that he has been feeling depressed and dealing with grief during the past few years. He lost both his parents in 2021 (mother to cancer, father to COVID), was diagnosed with PD in 2022, and has felt significantly increasing fatigue in the past year, stating that he enjoys his work but lately often feels he does not have the energy to continue with it. 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 He reports increasing tremors in his RUE and now finds it difficult to write; he reports that even signing his name is now a challenge. During today's session, he was able to don socks and  change gown INDly in sitting, with considerably increased time. He was able to perform standing balance for periods of ~ 3-4 minutes at a time before taking seated rest breaks. He transferred bed-recliner without AD although was slightly unsteady at moments. Recommend evaluation by PT for recommendation of possible DME to improve balance and reduce falls risks. Pt reports that he receives Rx for depression from his ID doctor at [facility] and has been on the same medication/dosage for past 4-5 years. Pt has appointments scheduled in Sept with both ID and endocrinology at [facility], but, given current condition, recommended that he attempt to see them earlier. Pt states he would like to seek counseling for depression and grief, saying that he is still struggling every day to deal with his parents' deaths and feels that this has taken a "heavy toll" on him. Also recommend outpatient OT to address balance, falls prevention, handwriting, and potential AE to mitigate effects of UE tremors. (Pt reports that his health care providers at [facility] had referred him to [facility] in [place] for OT/PT, but that he had not gone because it was too far a drive to make on a weekly basis. Informed pt that he could receive OT and PT services in [place]). Will provide ongoing OT while pt is hospitalized.    Recommendations for follow up therapy are one component of a multi-disciplinary discharge planning process, led by the attending physician.  Recommendations may be updated based on patient status, additional functional criteria and insurance authorization.   Follow Up Recommendations  Outpatient OT    Assistance  Recommended at Discharge    Patient can return home with the following A little help with walking and/or transfers;A little help with bathing/dressing/bathroom    Functional Status Assessment  Patient has had a recent decline in their functional status and demonstrates the ability to make significant improvements in  function in a reasonable and predictable amount of time.  Equipment Recommendations  None recommended by OT    Recommendations for Other Services Other (comment) (tx for depression/grief)     Precautions / Restrictions Precautions Precautions: Fall Restrictions Weight Bearing Restrictions: No      Mobility Bed Mobility Overal bed mobility: Needs Assistance Bed Mobility: Supine to Sit     Supine to sit: Supervision, HOB elevated     General bed mobility comments: extra time, no physical assistance required    Transfers Overall transfer level: Needs assistance Equipment used: None Transfers: Sit to/from Stand Sit to Stand: Supervision           General transfer comment: increased time, effort when transferring bed to recliner, no AD or physical assistance required      Balance Overall balance assessment: Needs assistance Sitting-balance support: Feet supported, Bilateral upper extremity supported       Standing balance support: During functional activity, No upper extremity supported Standing balance-Leahy Scale: Fair Standing balance comment: occasional small LOB in standing during fxl activity, w/ pt able to self-correct                           ADL either performed or assessed with clinical judgement   ADL                                               Vision         Perception     Praxis      Pertinent Vitals/Pain Pain Assessment Pain Assessment: No/denies pain     Hand Dominance Right   Extremity/Trunk Assessment Upper Extremity Assessment Upper Extremity Assessment: Generalized weakness   Lower Extremity Assessment Lower Extremity Assessment: Generalized weakness   Cervical / Trunk Assessment Cervical / Trunk Assessment: Normal   Communication Communication Communication: No difficulties   Cognition Arousal/Alertness: Awake/alert Behavior During Therapy: WFL for tasks assessed/performed Overall  Cognitive Status: Within Functional Limits for tasks assessed                                 General Comments: talkative, very pleasant. Tearful at times during session and admits that he is struggling with grief and depression.     General Comments       Exercises Other Exercises Other Exercises: Educ re: PoC, DC recs, communication with other health providers, importance of engagement in meaningful activities of choice   Shoulder Instructions      Home Living Family/patient expects to be discharged to:: Private residence Living Arrangements: Other relatives (nephew) Available Help at Discharge: Family;Available PRN/intermittently (reports his nephew & brother are both teachers out for the summer & can provide heavy assistance upon d/c) Type of Home: House Home Access: Stairs to enter [employer] of Steps: 2 Entrance Stairs-Rails: None Home Layout: One level               Home Equipment: None  Prior Functioning/Environment Prior Level of Function : Independent/Modified Independent;Working/employed;Driving             Mobility Comments: Working at [employer] ADLs Comments: IND in ADL/IADL. Works ~30 hrs./wk.        OT Problem List: Decreased strength;Decreased range of motion;Decreased activity tolerance;Impaired balance (sitting and/or standing);Decreased safety awareness;Decreased cognition;Impaired UE functional use      OT Treatment/Interventions: Self-care/ADL training;Patient/family education;Therapeutic exercise;Balance training;Energy conservation;Therapeutic activities;DME and/or AE instruction    OT Goals(Current goals can be found in the care plan section) Acute Rehab OT Goals Patient Stated Goal: to feel better OT Goal Formulation: With patient Time For Goal Achievement: 10/31/21 Potential to Achieve Goals: Good ADL Goals Pt Will Transfer to Toilet: with modified independence;regular height toilet (using  LRAD) Pt/caregiver will Perform Home Exercise Program: Increased ROM;Increased strength;Independently (for balance, strength, stress mgmt, and mental health) Additional ADL Goal #1: Pt will identify/demonstrate 2+ falls prevention techniques Additional ADL Goal #2: Pt will identify/demonstrate 2+ strategies for reducing impact in increased tremors in RUE  OT Frequency: Min 2X/week    Co-evaluation              AM-PAC OT "6 Clicks" Daily Activity     Outcome Measure Help from another person eating meals?: None Help from another person taking care of personal grooming?: None Help from another person toileting, which includes using toliet, bedpan, or urinal?: A Little Help from another person bathing (including washing, rinsing, drying)?: A Little Help from another person to put on and taking off regular upper body clothing?: None Help from another person to put on and taking off regular lower body clothing?: A Little 6 Click Score: 21   End of Session Nurse Communication: Mobility status  Activity Tolerance: Patient tolerated treatment well Patient left: in chair;with call bell/phone within reach  OT Visit Diagnosis: Muscle weakness (generalized) (M62.81);Other symptoms and signs involving the nervous system (R29.898);Other symptoms and signs involving cognitive function;Unsteadiness on feet (R26.81);Other abnormalities of gait and mobility (R26.89)                Time: 0903-0149 OT Time Calculation (min): 60 min Charges:  OT General Charges $OT Visit: 1 Visit OT Evaluation $OT Eval Moderate Complexity: 1 Mod OT Treatments $Self Care/Home Management : 38-52 mins Latina Craver, PhD, MS, OTR/L 10/17/21, 4:08 PM

## 2021-10-17 NOTE — Assessment & Plan Note (Signed)
Secondary to electrolyte imbalances from DKA.  Appreciate nephrology help.  Received bicarb infusion.  Resolved.

## 2021-10-17 NOTE — Consult Note (Signed)
PHARMACY CONSULT NOTE - FOLLOW UP  Pharmacy Consult for Electrolyte Monitoring and Replacement   Recent Labs: Potassium (mmol/L)  Date Value  10/17/2021 4.6   Magnesium (mg/dL)  Date Value  27/07/5007 2.4   Calcium (mg/dL)  Date Value  38/18/2993 8.9   Albumin (g/dL)  Date Value  71/69/6789 3.3 (L)   Phosphorus (mg/dL)  Date Value  38/01/1750 2.9   Sodium (mmol/L)  Date Value  10/17/2021 142     Assessment: 61 yo male with a PMH of Uncontrolled Type I Diabetes Mellitus who presented to [facility] ER via EMS from home on 06/16 with altered mental status. ON insulin gtt transitioning to SQ insulin.   Goal of Therapy:  WNL  Plan:  No replacement needed at this time.   Ronnald Ramp ,PharmD Clinical Pharmacist 10/17/2021 11:17 AM

## 2021-10-17 NOTE — Assessment & Plan Note (Signed)
Meets criteria for BMI greater than 30 

## 2021-10-18 DIAGNOSIS — I5032 Chronic diastolic (congestive) heart failure: Secondary | ICD-10-CM | POA: Diagnosis not present

## 2021-10-18 DIAGNOSIS — E1165 Type 2 diabetes mellitus with hyperglycemia: Secondary | ICD-10-CM

## 2021-10-18 DIAGNOSIS — E1110 Type 2 diabetes mellitus with ketoacidosis without coma: Secondary | ICD-10-CM | POA: Diagnosis not present

## 2021-10-18 DIAGNOSIS — G9340 Encephalopathy, unspecified: Secondary | ICD-10-CM | POA: Diagnosis not present

## 2021-10-18 DIAGNOSIS — B20 Human immunodeficiency virus [HIV] disease: Secondary | ICD-10-CM | POA: Diagnosis not present

## 2021-10-18 DIAGNOSIS — Z794 Long term (current) use of insulin: Secondary | ICD-10-CM

## 2021-10-18 LAB — BASIC METABOLIC PANEL
Anion gap: 5 (ref 5–15)
BUN: 22 mg/dL (ref 8–23)
CO2: 33 mmol/L — ABNORMAL HIGH (ref 22–32)
Calcium: 8.5 mg/dL — ABNORMAL LOW (ref 8.9–10.3)
Chloride: 98 mmol/L (ref 98–111)
Creatinine, Ser: 0.9 mg/dL (ref 0.61–1.24)
GFR, Estimated: >60 mL/min (ref >60)
Glucose, Bld: 250 mg/dL — ABNORMAL HIGH (ref 70–99)
Potassium: 4 mmol/L (ref 3.5–5.1)
Sodium: 136 mmol/L (ref 135–145)

## 2021-10-18 LAB — GLUCOSE, CAPILLARY
Glucose-Capillary: >600 mg/dL (ref 70–99)
Glucose-Capillary: 288 mg/dL — ABNORMAL HIGH (ref 70–99)
Glucose-Capillary: 371 mg/dL — ABNORMAL HIGH (ref 70–99)

## 2021-10-18 LAB — BRAIN NATRIURETIC PEPTIDE: B Natriuretic Peptide: 74.7 pg/mL (ref 0.0–100.0)

## 2021-10-18 MED ORDER — LEVOTHYROXINE SODIUM 50 MCG PO TABS: 50.0000 ug | ORAL_TABLET | Freq: Every day | ORAL | Status: DC

## 2021-10-18 MED ORDER — CITALOPRAM HYDROBROMIDE 20 MG PO TABS
40.0000 mg | ORAL_TABLET | Freq: Every day | ORAL | Status: DC
Start: 2021-10-18 — End: 2021-10-18
  Administered 2021-10-18: 40 mg via ORAL
  Filled 2021-10-18: qty 2

## 2021-10-18 NOTE — Discharge Summary (Addendum)
Physician Discharge Summary   Patient: Jeffrey Pearson MRN: [id number] DOB: 02/09/1961  Admit date:     10/15/2021  Discharge date: 10/18/21  Discharge Physician: Hollice Espy   PCP: Thana Ates, MD   Recommendations at discharge:   Patient discharged with home health RN to follow-up given reports of patient's memory and ability to care for self. Patient has a follow-up appointment in the next week or so with his endocrinologist.  At that time, determinations can be made on patient's insulin. At request of patient's brother, patient given information for grief counseling secondary to emotional stress from loss of patient's mother  Discharge Diagnoses: Active Problems:   Parkinson's disease (HCC)   Chronic diastolic CHF (congestive heart failure) (HCC)   Hypothyroidism   HIV (human immunodeficiency virus infection) (HCC)   Obesity (BMI 30-39.9)   Rhabdomyolysis   Uncontrolled type 2 diabetes mellitus with hyperglycemia, with long-term current use of insulin (HCC)  Principal Problem (Resolved):   Diabetic ketoacidosis (HCC) Resolved Problems:   Acute encephalopathy   Hyperkalemia   Hypernatremia  Hospital Course: 61 year old male with past medical history of diabetes mellitus type 1, Parkinson's disease hepatitis B, hypertension and hypothyroidism presented to the emergency room on 6/16 with confusion.  Reportedly, the patient had not been taking his medications for the past 2 weeks and has been doing intermittent fasting.  In the emergency room, patient was found to have a CBG greater than 1200 and in DKA.  Also noted to have potassium of greater than 7.5 and an EKG noting wide QRS and peaked T waves plus slight ST elevation in leads V1 and V2.  Patient was admitted to the ICU to the critical care service and started on an insulin drip plus IV fluids for his DKA.  Cardiology was consulted and it was felt that EKG changes were related more to electrolyte abnormalities.   Nephrology consulted for severe hyperkalemia and recommended initiation of isotonic bicarbonate infusion.  By 6/17, patient still confused however vital signs were stable and labs improved to the point where patient could get off of insulin drip.  Potassium had normalized.  Patient transferred to hospitalist service starting 6/18.  By 6/19, felt to be medically stable for discharge.    Assessment and Plan: * Diabetic ketoacidosis (HCC)-resolved as of 10/17/2021 Resolved, secondary to stopping his medications and starvation ketosis.  CBGs more stable and have been titrating up his Lantus and sliding scale.  Appreciate diabetic coordinator help.  Hyperkalemia-resolved as of 10/17/2021 Secondary to electrolyte imbalances from DKA.  Appreciate nephrology help.  Received bicarb infusion.  Resolved.  Acute encephalopathy-resolved as of 10/18/2021 Secondary to DKA.  Resolved  Hypernatremia-resolved as of 10/17/2021 Secondary severe dehydration brought on by DKA.  Initially patient sodium was normal on admission however he should have been pseudohyponatremia.  If 1 accounts for elevated CBGs and adjustment in sodium, patient's actual sodium on admission was greater than 150.  Now resolved with treatment of DKA and IV fluids.  Parkinson's disease (HCC) Continue Sinemet  Chronic diastolic CHF (congestive heart failure) (HCC) BNP within normal limits even despite aggressive fluid resuscitation.  Also of note that patient reportedly diuresed almost 10 L since hospitalization and almost -4 L deficient.  It is unclear given stable weight if this is accurate.  Hypothyroidism Continue Synthroid  HIV (human immunodeficiency virus infection) (HCC) Continue retrovirals  Obesity (BMI 30-39.9) Meets criteria for BMI greater than 30  Uncontrolled type 2 diabetes mellitus with hyperglycemia, with long-term  current use of insulin (HCC) A1c notes poor control, although it is unclear how much of this was from  him not taking his insulin in general versus undercontrolled.Patient appears on usual dose of NPH 40 units twice a day and then of regular insulin 30 units 3 times a day before meals.  A1c of 11.3.  Patient given insulin and glucometer.  Recommendation by diabetes coordinator is to change over to long-acting insulin such as Lantus or Levemir 28 units twice daily plus NovoLog 15 units 3 times daily with meals for meal coverage.         Consultants: Diabetes coordinator, critical care Procedures performed: None Disposition: Home with home health RN to monitor patient and diabetes control Diet recommendation:  Discharge Diet Orders (From admission, onward)     Start     Ordered   10/18/21 0000  Diet Carb Modified        10/18/21 1344           Cardiac and Carb modified diet DISCHARGE MEDICATION: Allergies as of 10/18/2021       Reactions   Septra [sulfamethoxazole-trimethoprim] Rash        Medication List     TAKE these medications    aspirin 325 MG tablet Take 325 mg by mouth daily.   atorvastatin 80 MG tablet Commonly known as: LIPITOR Take 80 mg by mouth daily.   Biktarvy 50-200-25 MG Tabs tablet Generic drug: bictegravir-emtricitabine-tenofovir AF Take 1 tablet by mouth daily.   carbidopa-levodopa 25-100 MG tablet Commonly known as: SINEMET IR Take 1 tablet by mouth 3 (three) times daily.   citalopram 40 MG tablet Commonly known as: CELEXA Take 40 mg by mouth daily.   insulin NPH Human 100 UNIT/ML injection Commonly known as: NOVOLIN N Inject 40 Units into the skin 2 (two) times daily before a meal.   insulin regular 100 units/mL injection Commonly known as: NOVOLIN R Inject 30 Units into the skin 2 (two) times daily before a meal. Takes 30 units around 8am and 30 units around 6pm   levothyroxine 50 MCG tablet Commonly known as: SYNTHROID Take 50 mcg by mouth daily.   lisinopril 40 MG tablet Commonly known as: ZESTRIL Take 40 mg by mouth daily.    traZODone 100 MG tablet Commonly known as: DESYREL Take 200 mg by mouth Nightly.   valACYclovir 500 MG tablet Commonly known as: VALTREX Take 500 mg by mouth daily.               Durable Medical Equipment  (From admission, onward)           Start     Ordered   10/18/21 1409  For home use only DME Walker rolling  Once       Question Answer Comment  Walker: With 5 Inch Wheels   Patient needs a walker to treat with the following condition Weakness      10/18/21 1408   10/17/21 1447  For home use only DME Walker rolling  Once       Question Answer Comment  Walker: With 5 Inch Wheels   Patient needs a walker to treat with the following condition General weakness      10/17/21 1446            Follow-up Information     [facility] Follow up.   Contact information: [phone]  [address] [place], [place]               Discharge  Exam: Filed Weights   10/16/21 0408 10/17/21 0410 10/18/21 0500  Weight: 105.1 kg 105.1 kg 105.1 kg   General: Alert and oriented x3, no acute distress Cardiovascular: Regular rate and rhythm, S1-S2 Lungs: Clear to auscultation bilaterally  Condition at discharge: good  The results of significant diagnostics from this hospitalization (including imaging, microbiology, ancillary and laboratory) are listed below for reference.   Imaging Studies: ECHOCARDIOGRAM COMPLETE  Result Date: 10/16/2021    ECHOCARDIOGRAM REPORT   Patient Name:   Emanual Lamountain III Date of Exam: 10/16/2021 Medical Rec #:  [id number]                 Height:       69.0 in Accession #:    [id number]                Weight:       231.7 lb Date of Birth:  07-Nov-1960                 BSA:          2.199 m Patient Age:    61 years                  BP:           133/73 mmHg Patient Gender: M                         HR:           85 bpm. Exam Location:  [facility] Procedure: 2D Echo Indications:     Elevated Troponin  History:         Patient has prior  history of Echocardiogram examinations, most                  recent 09/22/2018.  Sonographer:     Overton Mam RDCS Referring Phys:  [id number] Ezequiel Essex Diagnosing Phys: Alwyn Pea MD IMPRESSIONS  1. Left ventricular ejection fraction, by estimation, is 65 to 70%. The left ventricle has normal function. The left ventricle has no regional wall motion abnormalities. There is moderate concentric left ventricular hypertrophy. Left ventricular diastolic parameters are consistent with Grade I diastolic dysfunction (impaired relaxation).  2. Right ventricular systolic function is normal. The right ventricular size is normal.  3. The mitral valve is normal in structure. Trivial mitral valve regurgitation.  4. The aortic valve is normal in structure. Aortic valve regurgitation is not visualized. Aortic valve sclerosis/calcification is present, without any evidence of aortic stenosis. FINDINGS  Left Ventricle: Left ventricular ejection fraction, by estimation, is 65 to 70%. The left ventricle has normal function. The left ventricle has no regional wall motion abnormalities. The left ventricular internal cavity size was normal in size. There is  moderate concentric left ventricular hypertrophy. Left ventricular diastolic parameters are consistent with Grade I diastolic dysfunction (impaired relaxation). Right Ventricle: The right ventricular size is normal. No increase in right ventricular wall thickness. Right ventricular systolic function is normal. Left Atrium: Left atrial size was normal in size. Right Atrium: Right atrial size was normal in size. Pericardium: There is no evidence of pericardial effusion. Mitral Valve: The mitral valve is normal in structure. Trivial mitral valve regurgitation. Tricuspid Valve: The tricuspid valve is normal in structure. Tricuspid valve regurgitation is trivial. Aortic Valve: The aortic valve is normal in structure. Aortic valve regurgitation is not visualized. Aortic valve  sclerosis/calcification is present, without any evidence of aortic stenosis.  Aortic valve mean gradient measures 7.5 mmHg. Aortic valve peak  gradient measures 15.1 mmHg. Aortic valve area, by VTI measures 1.74 cm. Pulmonic Valve: The pulmonic valve was normal in structure. Pulmonic valve regurgitation is not visualized. Aorta: The ascending aorta was not well visualized. IAS/Shunts: No atrial level shunt detected by color flow Doppler.  LEFT VENTRICLE PLAX 2D LVIDd:         4.30 cm     Diastology LVIDs:         2.64 cm     LV e' medial:    5.87 cm/s LV PW:         1.44 cm     LV E/e' medial:  14.7 LV IVS:        1.65 cm     LV e' lateral:   8.05 cm/s LVOT diam:     1.90 cm     LV E/e' lateral: 10.7 LV SV:         62 LV SV Index:   28 LVOT Area:     2.84 cm  LV Volumes (MOD) LV vol d, MOD A2C: 64.6 ml LV vol d, MOD A4C: 70.4 ml LV vol s, MOD A2C: 17.9 ml LV vol s, MOD A4C: 21.4 ml LV SV MOD A2C:     46.7 ml LV SV MOD A4C:     70.4 ml LV SV MOD BP:      54.8 ml RIGHT VENTRICLE RV Basal diam:  2.99 cm RV S prime:     15.30 cm/s TAPSE (M-mode): 2.2 cm LEFT ATRIUM             Index        RIGHT ATRIUM          Index LA diam:        3.30 cm 1.50 cm/m   RA Area:     8.53 cm LA Vol (A2C):   35.2 ml 16.00 ml/m  RA Volume:   15.30 ml 6.96 ml/m LA Vol (A4C):   37.5 ml 17.05 ml/m LA Biplane Vol: 38.0 ml 17.28 ml/m  AORTIC VALVE                     PULMONIC VALVE AV Area (Vmax):    1.55 cm      PV Vmax:       1.09 m/s AV Area (Vmean):   1.59 cm      PV Peak grad:  4.8 mmHg AV Area (VTI):     1.74 cm AV Vmax:           194.00 cm/s AV Vmean:          125.500 cm/s AV VTI:            0.358 m AV Peak Grad:      15.1 mmHg AV Mean Grad:      7.5 mmHg LVOT Vmax:         106.00 cm/s LVOT Vmean:        70.600 cm/s LVOT VTI:          0.220 m LVOT/AV VTI ratio: 0.62  AORTA Ao Root diam: 3.60 cm Ao Asc diam:  3.30 cm MITRAL VALVE               TRICUSPID VALVE MV Area (PHT): 3.68 cm    TV Peak grad:   17.0 mmHg MV Decel Time: 206  msec    TV Vmax:  2.06 m/s MV E velocity: 86.50 cm/s MV A velocity: 96.00 cm/s  SHUNTS MV E/A ratio:  0.90        Systemic VTI:  0.22 m                            Systemic Diam: 1.90 cm Alwyn Peawayne D Callwood MD Electronically signed by Alwyn Peawayne D Callwood MD Signature Date/Time: 10/16/2021/11:07:36 AM    Final    CT HEAD WO CONTRAST (5MM)  Result Date: 10/15/2021 CLINICAL DATA:  Mental status change.  Unknown cause. EXAM: CT HEAD WITHOUT CONTRAST TECHNIQUE: Contiguous axial images were obtained from the base of the skull through the vertex without intravenous contrast. RADIATION DOSE REDUCTION: This exam was performed according to the departmental dose-optimization program which includes automated exposure control, adjustment of the mA and/or kV according to patient size and/or use of iterative reconstruction technique. COMPARISON:  CT head without contrast 03/11/2019 FINDINGS: Brain: Moderate periventricular and subcortical white matter hypoattenuation is again seen bilaterally. No acute infarct, hemorrhage, or mass lesion is present. Basal ganglia are intact. The ventricles are of normal size. No significant extraaxial fluid collection is present. The brainstem and cerebellum are within normal limits. Vascular: Atherosclerotic calcifications are present within the cavernous internal carotid arteries. No hyperdense vessel is present. Skull: Calvarium is intact. No focal lytic or blastic lesions are present. No significant extracranial soft tissue lesion is present. Sinuses/Orbits: The paranasal sinuses and mastoid air cells are clear. The globes and orbits are within normal limits. IMPRESSION: 1. Stable moderate atrophy and subcortical white matter disease. This likely reflects the sequela of chronic microvascular ischemia. 2. No acute intracranial abnormality or significant interval change. Electronically Signed   By: Marin Robertshristopher  Mattern M.D.   On: 10/15/2021 21:05   DG Chest 1 View  Result Date:  10/15/2021 CLINICAL DATA:  Edema EXAM: CHEST  1 VIEW COMPARISON:  08/23/2018 FINDINGS: Heart size within normal limits. Aortic atherosclerosis. Low lung volumes with streaky bibasilar opacities. No pleural effusion or pneumothorax. Surgical clips at the base of the neck. IMPRESSION: Low lung volumes with streaky bibasilar opacities, likely atelectasis. Electronically Signed   By: Duanne GuessNicholas  Plundo D.O.   On: 10/15/2021 14:07    Microbiology: Results for orders placed or performed during the hospital encounter of 10/15/21  MRSA Next Gen by PCR, Nasal     Status: None   Collection Time: 10/15/21  4:50 PM   Specimen: Nasal Mucosa; Nasal Swab  Result Value Ref Range Status   MRSA by PCR Next Gen NOT DETECTED NOT DETECTED Final    Comment: (NOTE) The GeneXpert MRSA Assay (FDA approved for NASAL specimens only), is one component of a comprehensive MRSA colonization surveillance program. It is not intended to diagnose MRSA infection nor to guide or monitor treatment for MRSA infections. Test performance is not FDA approved in patients less than 61 years old. Performed at [facility], [address] [place], [place] [address]     Labs: CBC: Recent Labs  Lab 10/15/21 1322 10/15/21 1553 10/16/21 0528 10/17/21 0539  WBC 14.9* 10.4 13.7* 5.9  NEUTROABS 13.7*  --   --  4.5  HGB 10.7* 9.5* 11.9* 11.9*  HCT 35.2* 29.9* 34.1* 35.8*  MCV 104.8* 100.3* 91.4 94.2  PLT 149* 107* 140* 104*   Basic Metabolic Panel: Recent Labs  Lab 10/15/21 1553 10/15/21 1611 10/16/21 0528 10/16/21 0941 10/16/21 1330 10/16/21 1724 10/17/21 0539 10/18/21 0311  NA  --    < >  149* 149* 149* 147* 142 136  K  --    < > 4.7 4.3 4.2 4.2 4.6 4.0  CL  --    < > 108 108 109 106 103 98  CO2  --    < > 33* 34* 34* 32 32 33*  GLUCOSE  --    < > 150* 181* 189* 235* 290* 250*  BUN  --    < > 62* 55* 50* 48* 30* 22  CREATININE  --    < > 1.88* 1.61* 1.52* 1.45* 1.10 0.90  CALCIUM  --    < > 9.3 9.4 9.2 8.9  8.9 8.5*  MG 2.9*  --  3.2*  --   --   --  2.4  --   PHOS 7.3*  --  5.5*  --   --   --  2.9  --    < > = values in this interval not displayed.   Liver Function Tests: Recent Labs  Lab 10/15/21 1322 10/17/21 0539  AST 134* 155*  ALT 48* 38  ALKPHOS 117 102  BILITOT 1.7* 1.2  PROT 7.0 6.6  ALBUMIN 3.7 3.3*   CBG: Recent Labs  Lab 10/17/21 1337 10/17/21 1654 10/17/21 2140 10/18/21 0752 10/18/21 1109  GLUCAP 320* 305* 351* 288* 371*    Discharge time spent: greater than 30 minutes.  Signed: Hollice Espy, MD [facility] 10/18/2021

## 2021-10-18 NOTE — Assessment & Plan Note (Signed)
BNP within normal limits even despite aggressive fluid resuscitation.  Also of note that patient reportedly diuresed almost 10 L since hospitalization and almost -4 L deficient.  It is unclear given stable weight if this is accurate.

## 2021-10-18 NOTE — TOC Progression Note (Addendum)
Transition of Care [facility]) - Progression Note    Patient Details  Name: Nathaneal Sommers MRN: [id number] Date of Birth: 1961/01/10  [facility] of [facility]) CM/SW Contact  Maree Krabbe, LCSW Phone Number: 10/18/2021, 2:24 PM  Clinical Narrative:  Pt chooses not to wait on a walker and would prefer it be drop shipped to his home. [employer] made aware.   CSW reached out to  [facility]  [employer] [facility]  All have declined for [facility]- insulin and med management.        Expected Discharge Plan and Services           Expected Discharge Date: 10/18/21                                     Social Determinants of Health (SDOH) Interventions    Readmission Risk Interventions     No data to display

## 2021-10-18 NOTE — Consult Note (Signed)
PHARMACY CONSULT NOTE - FOLLOW UP  Pharmacy Consult for Electrolyte Monitoring and Replacement   Recent Labs: Potassium (mmol/L)  Date Value  10/18/2021 4.0   Magnesium (mg/dL)  Date Value  93/26/7124 2.4   Calcium (mg/dL)  Date Value  58/01/9832 8.5 (L)   Albumin (g/dL)  Date Value  82/50/5397 3.3 (L)   Phosphorus (mg/dL)  Date Value  67/34/1937 2.9   Sodium (mmol/L)  Date Value  10/18/2021 136   Corrected Ca: 9.06 mg/dL  Assessment: 61 yo male with a PMH of Uncontrolled Type I Diabetes Mellitus who presented to [facility] ER via EMS from home on 06/16 with altered mental status 2/2 DKA. Transitioned from insulin gtt > SQ insulin 6/18.   Goal of Therapy:  WNL  Plan:  No replacement needed at this time. Electrolytes have remained stable. Pharmacy will sign off at this time.   Jaynie Bream, PharmD Pharmacy Resident  10/18/2021 7:18 AM

## 2021-10-18 NOTE — Inpatient Diabetes Management (Addendum)
Inpatient Diabetes Program Recommendations  AACE/ADA: New Consensus Statement on Inpatient Glycemic Control   Target Ranges:  Prepandial:   less than 140 mg/dL      Peak postprandial:   less than 180 mg/dL (1-2 hours)      Critically ill patients:  140 - 180 mg/dL    Latest Reference Range & Units 10/18/21 03:11  Glucose 70 - 99 mg/dL 258 (H)    Latest Reference Range & Units 10/17/21 07:46 10/17/21 9:17 10/17/21 11:23 10/17/21 13:37 10/17/21 16:54 10/17/21 21:40  Glucose-Capillary 70 - 99 mg/dL 527 (H)   Novolog 14 units  Semglee 20 units 382 (H) 320 (H)  Novolog 14 units 305 (H)  Novolog 16 units 351 (H)  Novolog 5 units   Semglee 25 units    Latest Reference Range & Units 10/15/21 15:53  Hemoglobin A1C 4.8 - 5.6 % 11.3 (H)   Review of Glycemic Control  Diabetes history: DM1  Outpatient Diabetes medications: Levemir 25 units BID, Novolog 20 units TID with meals Current orders for Inpatient glycemic control: Semglee 25 units BID, Novolog 5 units TID with meals, Novolog 0-15 units TID with meals, Novolog 0-5 units QHS  Inpatient Diabetes Program Recommendations:    Insulin: Please consider increasing Semglee to 28 units BID and meal coverage to Novolog 15 units TID with meals for meal coverage if patient eats at least 50% of meals.  NOTE: In reviewing chart, noted patient sees Dr. Graciela Husbands [facility]ocrinologist) and was last seen 06/07/21. Per office note on 06/07/21 by Dr. Graciela Husbands, "HbA1c 10.6%, improved from 11.6%. Unfortunately, [employer] no longer covers [place], so he is currently taking 30U Levemir BID. He also takes 20U Novolog in the morning and evening around when he is eating. He may take an addition 10-20 units if he is peeing more than normal. He has no glucose meter at the moment, but would really like a CGM. Recently obtained insurance and is ready to switch medications as possible. He has been frustrated with how difficult it has been to arrange insulin rx and CGM. Prescribed  Tresiba 60U, but requested any long acting insulin that is affordable (besides levemir be exchanged). 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 Patient reports that he sees Dr. Graciela Husbands for DM management and that he is prescribed NPH 40 units BID (takes at 9am and at bedtime) and Regular 30 units at 8am and Regular 30 units around 6pm.  Patient reports that he was very unhappy with his weight (up to 237 lbs) so he recently done a 10 day fast. Patient reports that since he was not eating, he was not taking insulin consistently and he had decreased his NPH to 20 units BID and was not taking any Regular very often since not eating. Patient has not been checking glucose (does not do finger sticks) and reports that he recently got Dexcom G7 CGM prescribed and has received it but has not been to training on it yet. Patient reports he use to use a Dexcom (older version) so he is mainly aware of how it works. Patient states he does not have any appointment set up for [facility] training yet.  Inquired about prior A1C and patient reports his last A1C was in 9% range.  Discussed A1C results (11.3% on 10/15/21) and explained that current A1C indicates an average glucose of 278 mg/dl over the past 2-3 months. Discussed glucose  and A1C goals. Discussed importance of checking CBGs and maintaining good CBG control to prevent long-term and short-term complications. Explained how hyperglycemia leads to damage within blood vessels which lead to the common complications seen with uncontrolled diabetes. Stressed to the patient the importance of talking with his provider if he plans to try to lose weight to discuss safe options and discuss if insulin needs to be adjusted. Patient also reports that he was going to the gym 5 days a week but after recent lose of parent, he stopped going to the gym. Encouraged patient to  try to resume going to gym 5 days a week as it would help glucose as well as weight. Patient reports that he has his next appointment with Dr. Graciela Husbands in September. Asked patient to call Dr. Odessa Fleming office and see if he can get appointment moved up to be seen within the next 3-4 weeks. Also encouraged patient to make appointment to get [facility] training so he can start using it to monitor glucose and allow more information for Dr. Graciela Husbands to help get DM under better control. Inquired about possibility of using FreeStyle Libre2 CGM until he can go to [facility] training and patient states he would be willing to use Libre2 between now and then. Will ask attending provider for an order to provide patient with sample FreeStyle Libre2 CGM and help patient apply before discharge. Patient states he used [employer] in the past so he also familiar with how to use it.  Patient states that he has all DM medications at home. Encouraged patient to take DM medications as prescribed, to follow up with Dr. Graciela Husbands, and get appointment for [facility] training. Patient verbalized understanding of information discussed and reports no further questions at this time related to diabetes. Dr. Rito Ehrlich provided order to give patient samples of FreeStyle Libre2. Provided patient to 2 FreeStyle Libre2 CGM sensors at 14:00. Patient's cell phone is at home so he does not have it currently here at the hospital. Patient states he is comfortable with applying the sensor once he gets home so he can use the [employer] app on his phone to read the sensor. Encouraged patient to get it started as soon as he gets home.   Thanks, Jeffrey Penner, RN, MSN, CDCES Diabetes Coordinator Inpatient Diabetes Program [phone] (Team Pager from 8am to 5pm)

## 2021-10-18 NOTE — Assessment & Plan Note (Signed)
A1c notes poor control, although it is unclear how much of this was from him not taking his insulin in general versus undercontrolled.Patient appears on usual dose of NPH 40 units twice a day and then of regular insulin 30 units 3 times a day before meals.  A1c of 11.3.  Patient given insulin and glucometer.  Recommendation by diabetes coordinator is to change over to long-acting insulin such as Lantus or Levemir 28 units twice daily plus NovoLog 15 units 3 times daily with meals for meal coverage.

## 2021-11-25 ENCOUNTER — Emergency Department
Admission: EM | Admit: 2021-11-25 | Discharge: 2021-11-25 | Disposition: A | Discharge status: Home / Self Care | Payer: BLUE CROSS/BLUE SHIELD | Attending: Emergency Medicine | Admitting: Emergency Medicine

## 2021-11-25 ENCOUNTER — Emergency Department: Payer: BLUE CROSS/BLUE SHIELD

## 2021-11-25 ENCOUNTER — Other Ambulatory Visit: Payer: Self-pay

## 2021-11-25 DIAGNOSIS — E119 Type 2 diabetes mellitus without complications: Secondary | ICD-10-CM | POA: Diagnosis not present

## 2021-11-25 DIAGNOSIS — I509 Heart failure, unspecified: Secondary | ICD-10-CM | POA: Insufficient documentation

## 2021-11-25 DIAGNOSIS — G20 Parkinson's disease: Secondary | ICD-10-CM | POA: Diagnosis not present

## 2021-11-25 DIAGNOSIS — K6289 Other specified diseases of anus and rectum: Secondary | ICD-10-CM | POA: Diagnosis present

## 2021-11-25 DIAGNOSIS — N410 Acute prostatitis: Secondary | ICD-10-CM | POA: Diagnosis not present

## 2021-11-25 DIAGNOSIS — Z21 Asymptomatic human immunodeficiency virus [HIV] infection status: Secondary | ICD-10-CM | POA: Diagnosis not present

## 2021-11-25 HISTORY — DX: Parkinson's disease without dyskinesia, without mention of fluctuations: G20.A1

## 2021-11-25 LAB — BASIC METABOLIC PANEL
Anion gap: 7 (ref 5–15)
BUN: 12 mg/dL (ref 8–23)
CO2: 26 mmol/L (ref 22–32)
Calcium: 8.7 mg/dL — ABNORMAL LOW (ref 8.9–10.3)
Chloride: 101 mmol/L (ref 98–111)
Creatinine, Ser: 1 mg/dL (ref 0.61–1.24)
GFR, Estimated: >60 mL/min (ref >60)
Glucose, Bld: 269 mg/dL — ABNORMAL HIGH (ref 70–99)
Potassium: 3.9 mmol/L (ref 3.5–5.1)
Sodium: 134 mmol/L — ABNORMAL LOW (ref 135–145)

## 2021-11-25 LAB — URINALYSIS, ROUTINE W REFLEX MICROSCOPIC
Bilirubin Urine: NEGATIVE
Glucose, UA: >=500 mg/dL — AB
Ketones, ur: NEGATIVE mg/dL
Nitrite: NEGATIVE
Protein, ur: 100 mg/dL — AB
Specific Gravity, Urine: 1.012 (ref 1.005–1.030)
pH: 6 (ref 5.0–8.0)

## 2021-11-25 LAB — CBC WITH DIFFERENTIAL/PLATELET
Abs Immature Granulocytes: 0.01 10*3/uL (ref 0.00–0.07)
Basophils Absolute: 0 10*3/uL (ref 0.0–0.1)
Basophils Relative: 1 %
Eosinophils Absolute: 0.2 10*3/uL (ref 0.0–0.5)
Eosinophils Relative: 4 %
HCT: 33.6 % — ABNORMAL LOW (ref 39.0–52.0)
Hemoglobin: 11.5 g/dL — ABNORMAL LOW (ref 13.0–17.0)
Immature Granulocytes: 0 %
Lymphocytes Relative: 28 %
Lymphs Abs: 1.2 10*3/uL (ref 0.7–4.0)
MCH: 31.4 pg (ref 26.0–34.0)
MCHC: 34.2 g/dL (ref 30.0–36.0)
MCV: 91.8 fL (ref 80.0–100.0)
Monocytes Absolute: 0.3 10*3/uL (ref 0.1–1.0)
Monocytes Relative: 6 %
Neutro Abs: 2.7 10*3/uL (ref 1.7–7.7)
Neutrophils Relative %: 61 %
Platelets: 158 10*3/uL (ref 150–400)
RBC: 3.66 MIL/uL — ABNORMAL LOW (ref 4.22–5.81)
RDW: 12.3 % (ref 11.5–15.5)
WBC: 4.4 10*3/uL (ref 4.0–10.5)
nRBC: 0 % (ref 0.0–0.2)

## 2021-11-25 MED ORDER — IOHEXOL 300 MG/ML  SOLN
100.0000 mL | Freq: Once | INTRAMUSCULAR | Status: AC | PRN
Start: 2021-11-25 — End: 2021-11-25
  Administered 2021-11-25: 100 mL via INTRAVENOUS

## 2021-11-25 MED ORDER — HYDROCODONE-ACETAMINOPHEN 5-325 MG PO TABS: 1.0000 | ORAL_TABLET | Freq: Four times a day (QID) | ORAL | 0 refills | Status: AC | PRN

## 2021-11-25 MED ORDER — CIPROFLOXACIN HCL 500 MG PO TABS: 500.0000 mg | ORAL_TABLET | Freq: Two times a day (BID) | ORAL | 0 refills | Status: AC

## 2021-11-25 MED ORDER — SODIUM CHLORIDE 0.9 % IV BOLUS
1000.0000 mL | Freq: Once | INTRAVENOUS | Status: AC
  Administered 2021-11-25: 1000 mL via INTRAVENOUS

## 2021-11-25 MED ORDER — MORPHINE SULFATE (PF) 4 MG/ML IV SOLN
4.0000 mg | Freq: Once | INTRAVENOUS | Status: AC
  Administered 2021-11-25: 4 mg via INTRAVENOUS
  Filled 2021-11-25: qty 1

## 2021-11-25 MED ORDER — ONDANSETRON HCL 4 MG/2ML IJ SOLN
4.0000 mg | Freq: Once | INTRAMUSCULAR | Status: AC
  Administered 2021-11-25: 4 mg via INTRAVENOUS
  Filled 2021-11-25: qty 2

## 2021-11-25 NOTE — Discharge Instructions (Addendum)
Follow-up with your regular doctor.  Please call for an appointment.  You may also need to see a urologist.  If you do not currently have 1 please follow-up with the urologist phone number that is attached your papers. Take the antibiotic as prescribed. Take pain medication only as needed.  Return if worsening

## 2021-11-25 NOTE — ED Triage Notes (Signed)
Pt noticed some pain near his anus on Monday. He had a colonoscopy that day and they were unable to see anything. Pt states the pain has gotten worse and the are has gotten bigger. Pt went to his primary care who referred him to GI. Pt has a appt with GI on Monday but states he cant bear the pain.

## 2021-11-25 NOTE — ED Provider Notes (Signed)
[facility] Provider Note    Event Date/Time   First MD Initiated Contact with Patient 11/25/21 [phone]     (approximate)   History   Abscess   HPI  Jeffrey Pearson is a 61 y.o. male with history of HIV, Parkinson's, CHF, type 2 diabetes who had a recent colonoscopy on Monday presents with increasing rectal pain.  Patient states he thinks he has a perirectal abscess.  States he saw his PCP and they could not see anything externally.  Denies fever or chills.  Denies anterior abdominal pain      Physical Exam   Triage Vital Signs: ED Triage Vitals  Enc Vitals Group     BP 11/25/21 1830 (!) 184/105     Pulse Rate 11/25/21 1830 78     Resp 11/25/21 1830 17     Temp 11/25/21 1830 98.4 F (36.9 C)     Temp Source 11/25/21 1830 Oral     SpO2 11/25/21 1830 96 %     Weight 11/25/21 1831 225 lb (102.1 kg)     Height 11/25/21 1831 5\' 9"  (1.753 m)     Head Circumference --      Peak Flow --      Pain Score 11/25/21 1830 9     Pain Loc --      Pain Edu? --      Excl. in GC? --     Most recent vital signs: Vitals:   11/25/21 1830  BP: (!) 184/105  Pulse: 78  Resp: 17  Temp: 98.4 F (36.9 C)  SpO2: 96%     General: Awake, no distress.   CV:  Good peripheral perfusion. regular rate and  rhythm Resp:  Normal effort.  Abd:  No distention.   Other:  Rectal exam reproduces pain on anterior palpation, prostate was not appreciated   ED Results / Procedures / Treatments   Labs (all labs ordered are listed, but only abnormal results are displayed) Labs Reviewed  BASIC METABOLIC PANEL - Abnormal; Notable for the following components:      Result Value   Sodium 134 (*)    Glucose, Bld 269 (*)    Calcium 8.7 (*)    All other components within normal limits  CBC WITH DIFFERENTIAL/PLATELET - Abnormal; Notable for the following components:   RBC 3.66 (*)    Hemoglobin 11.5 (*)    HCT 33.6 (*)    All other components within normal limits   URINALYSIS, ROUTINE W REFLEX MICROSCOPIC - Abnormal; Notable for the following components:   Color, Urine STRAW (*)    APPearance CLEAR (*)    Glucose, UA >=500 (*)    Hgb urine dipstick SMALL (*)    Protein, ur 100 (*)    Leukocytes,Ua SMALL (*)    Bacteria, UA FEW (*)    All other components within normal limits  URINE CULTURE     EKG     RADIOLOGY CT of the pelvis with contrast    PROCEDURES:   Procedures   MEDICATIONS ORDERED IN ED: Medications  sodium chloride 0.9 % bolus 1,000 mL (1,000 mLs Intravenous New Bag/Given 11/25/21 1938)  morphine (PF) 4 MG/ML injection 4 mg (4 mg Intravenous Given 11/25/21 1948)  ondansetron (ZOFRAN) injection 4 mg (4 mg Intravenous Given 11/25/21 1948)  iohexol (OMNIPAQUE) 300 MG/ML solution 100 mL (100 mLs Intravenous Contrast Given 11/25/21 2012)     IMPRESSION / MDM / ASSESSMENT AND PLAN / ED COURSE  I reviewed the triage vital signs and the nursing notes.                              Differential diagnosis includes, but is not limited to, abscess, prostatitis, perforation  Patient's presentation is most consistent with acute complicated illness / injury requiring diagnostic workup.   Labs and imaging ordered Normal saline 1 L as patient states feels dehydrated  Patient's labs are reassuring except for the urine.  Urine appears to have some bacteria and leuks.  Due to his HIV status did order a urine culture.  CT of the pelvis with contrast individually reviewed and interpreted by me as being negative.  Patient be placed on Cipro 500 twice daily for 14 days, follow-up with this regular physician or urology.  Pain medication.  Return if worsening.  He is in agreement treatment plan.  Discharged stable condition.   FINAL CLINICAL IMPRESSION(S) / ED DIAGNOSES   Final diagnoses:  Acute prostatitis     Rx / DC Orders   ED Discharge Orders     None        Note:  This document was prepared using Dragon voice  recognition software and may include unintentional dictation errors.    Faythe Ghee, PA-C 11/25/21 2037    Minna Antis, MD 11/29/21 1440

## 2021-11-28 LAB — URINE CULTURE: Culture: 80000 — AB

## 2021-11-29 ENCOUNTER — Other Ambulatory Visit: Payer: Self-pay

## 2021-11-29 ENCOUNTER — Emergency Department: Payer: BLUE CROSS/BLUE SHIELD

## 2021-11-29 ENCOUNTER — Emergency Department
Admission: EM | Admit: 2021-11-29 | Discharge: 2021-11-29 | Disposition: A | Discharge status: Home / Self Care | Payer: BLUE CROSS/BLUE SHIELD | Attending: Emergency Medicine | Admitting: Emergency Medicine

## 2021-11-29 DIAGNOSIS — E039 Hypothyroidism, unspecified: Secondary | ICD-10-CM | POA: Diagnosis not present

## 2021-11-29 DIAGNOSIS — E119 Type 2 diabetes mellitus without complications: Secondary | ICD-10-CM | POA: Diagnosis not present

## 2021-11-29 DIAGNOSIS — I5032 Chronic diastolic (congestive) heart failure: Secondary | ICD-10-CM | POA: Diagnosis not present

## 2021-11-29 DIAGNOSIS — Z21 Asymptomatic human immunodeficiency virus [HIV] infection status: Secondary | ICD-10-CM | POA: Diagnosis not present

## 2021-11-29 DIAGNOSIS — G20 Parkinson's disease: Secondary | ICD-10-CM | POA: Insufficient documentation

## 2021-11-29 DIAGNOSIS — K6289 Other specified diseases of anus and rectum: Secondary | ICD-10-CM | POA: Insufficient documentation

## 2021-11-29 LAB — BASIC METABOLIC PANEL
Anion gap: 10 (ref 5–15)
BUN: 21 mg/dL (ref 8–23)
CO2: 25 mmol/L (ref 22–32)
Calcium: 9.1 mg/dL (ref 8.9–10.3)
Chloride: 100 mmol/L (ref 98–111)
Creatinine, Ser: 1.35 mg/dL — ABNORMAL HIGH (ref 0.61–1.24)
GFR, Estimated: 60 mL/min — ABNORMAL LOW (ref >60)
Glucose, Bld: 181 mg/dL — ABNORMAL HIGH (ref 70–99)
Potassium: 4.5 mmol/L (ref 3.5–5.1)
Sodium: 135 mmol/L (ref 135–145)

## 2021-11-29 LAB — URINALYSIS, ROUTINE W REFLEX MICROSCOPIC
Bacteria, UA: NONE SEEN
Bilirubin Urine: NEGATIVE
Glucose, UA: 150 mg/dL — AB
Ketones, ur: NEGATIVE mg/dL
Nitrite: NEGATIVE
Protein, ur: NEGATIVE mg/dL
Specific Gravity, Urine: 1.005 (ref 1.005–1.030)
pH: 6 (ref 5.0–8.0)

## 2021-11-29 LAB — CBC WITH DIFFERENTIAL/PLATELET
Abs Immature Granulocytes: 0.02 10*3/uL (ref 0.00–0.07)
Basophils Absolute: 0 10*3/uL (ref 0.0–0.1)
Basophils Relative: 1 %
Eosinophils Absolute: 0.2 10*3/uL (ref 0.0–0.5)
Eosinophils Relative: 4 %
HCT: 34.8 % — ABNORMAL LOW (ref 39.0–52.0)
Hemoglobin: 11.7 g/dL — ABNORMAL LOW (ref 13.0–17.0)
Immature Granulocytes: 0 %
Lymphocytes Relative: 18 %
Lymphs Abs: 1.1 10*3/uL (ref 0.7–4.0)
MCH: 32.1 pg (ref 26.0–34.0)
MCHC: 33.6 g/dL (ref 30.0–36.0)
MCV: 95.6 fL (ref 80.0–100.0)
Monocytes Absolute: 0.4 10*3/uL (ref 0.1–1.0)
Monocytes Relative: 7 %
Neutro Abs: 4.1 10*3/uL (ref 1.7–7.7)
Neutrophils Relative %: 70 %
Platelets: 178 10*3/uL (ref 150–400)
RBC: 3.64 MIL/uL — ABNORMAL LOW (ref 4.22–5.81)
RDW: 12.5 % (ref 11.5–15.5)
WBC: 5.8 10*3/uL (ref 4.0–10.5)
nRBC: 0 % (ref 0.0–0.2)

## 2021-11-29 LAB — BETA-HYDROXYBUTYRIC ACID: Beta-Hydroxybutyric Acid: 0.07 mmol/L (ref 0.05–0.27)

## 2021-11-29 MED ORDER — KETOROLAC TROMETHAMINE 15 MG/ML IJ SOLN
15.0000 mg | Freq: Once | INTRAMUSCULAR | Status: AC
  Administered 2021-11-29: 15 mg via INTRAVENOUS
  Filled 2021-11-29: qty 1

## 2021-11-29 MED ORDER — IOHEXOL 300 MG/ML  SOLN
100.0000 mL | Freq: Once | INTRAMUSCULAR | Status: AC | PRN
  Administered 2021-11-29: 100 mL via INTRAVENOUS

## 2021-11-29 MED ORDER — SODIUM CHLORIDE 0.9 % IV BOLUS
1000.0000 mL | Freq: Once | INTRAVENOUS | Status: AC
  Administered 2021-11-29: 1000 mL via INTRAVENOUS

## 2021-11-29 MED ORDER — OXYCODONE HCL 5 MG PO TABS: 5.0000 mg | ORAL_TABLET | Freq: Three times a day (TID) | ORAL | 0 refills | Status: AC | PRN

## 2021-11-29 NOTE — ED Provider Notes (Signed)
[facility] Provider Note    Event Date/Time   First MD Initiated Contact with Patient 11/29/21 1107     (approximate)   History   Rectal Pain   HPI  Jeffrey Pearson is a 61 y.o. male with a past medical history of type 2 diabetes, HIV (viral load undetectable, CD4 count 350), rectal condyloma, Parkinson's on levodopa who presents today for evaluation of continued rectal pain.  Patient reports that he was recently diagnosed with prostatitis and started on ciprofloxacin.  He reports that he has continued to have increasing pain.  He reports that he has been "feeling back there" and feels that he has some firmness to the right side of his rectum.  He has not had any bleeding or discharge.  He feels that he has had mild straining with urination, though no burning with urination.  No fevers or chills.  Patient Active Problem List   Diagnosis Date Noted   Uncontrolled type 2 diabetes mellitus with hyperglycemia, with long-term current use of insulin (HCC) 10/18/2021   Chronic diastolic CHF (congestive heart failure) (HCC) 10/17/2021   Rhabdomyolysis 10/17/2021   Hypothyroidism    HIV (human immunodeficiency virus infection) (HCC)    Parkinson's disease (HCC)    Obesity (BMI 30-39.9)    Type 2 diabetes mellitus with hyperosmolar nonketotic hyperglycemia (HCC) 03/11/2019   TIA (transient ischemic attack) 09/21/2018   Hyperglycemic hyperosmolar nonketotic coma (HCC) 08/23/2018          Physical Exam   Triage Vital Signs: ED Triage Vitals  Enc Vitals Group     BP 11/29/21 1016 (!) 150/77     Pulse Rate 11/29/21 1016 78     Resp 11/29/21 1016 17     Temp 11/29/21 1016 99.4 F (37.4 C)     Temp Source 11/29/21 1016 Oral     SpO2 11/29/21 1016 94 %     Weight 11/29/21 1019 224 lb 13.9 oz (102 kg)     Height 11/29/21 1019 5\' 9"  (1.753 m)     Head Circumference --      Peak Flow --      Pain Score 11/29/21 1007 9     Pain Loc --      Pain  Edu? --      Excl. in GC? --     Most recent vital signs: Vitals:   11/29/21 1332 11/29/21 1458  BP: (!) 151/79 (!) 150/70  Pulse: 72 75  Resp: 18 18  Temp: 98 F (36.7 C) 98.6 F (37 C)  SpO2: 93% 98%    Physical Exam Vitals and nursing note reviewed.  Constitutional:      General: Awake and alert. No acute distress.    Appearance: Normal appearance. The patient is normal weight.  HENT:     Head: Normocephalic and atraumatic.     Mouth: Mucous membranes are moist.  Eyes:     General: PERRL. Normal EOMs        Right eye: No discharge.        Left eye: No discharge.     Conjunctiva/sclera: Conjunctivae normal.  Cardiovascular:     Rate and Rhythm: Normal rate and regular rhythm.     Pulses: Normal pulses.  Pulmonary:     Effort: Pulmonary effort is normal. No respiratory distress.  Abdominal:     Abdomen is soft. There is no abdominal tenderness. No rebound or guarding. No distention. Rectum: No hemorrhoids noted.  He has 2  visible small condyloma.  He has pain with digital rectal exam with fluctuance felt along the left lateral wall.  No bleeding.  No stool in the rectal vault.  No erythema or ecchymosis.  No crepitus.  No abnormal findings to the perineum or scrotum Musculoskeletal:        General: No swelling. Normal range of motion.     Cervical back: Normal range of motion and neck supple.  Skin:    General: Skin is warm and dry.     Capillary Refill: Capillary refill takes less than 2 seconds.     Findings: No rash.  Neurological:     Mental Status: The patient is awake and alert.      ED Results / Procedures / Treatments   Labs (all labs ordered are listed, but only abnormal results are displayed) Labs Reviewed  CBC WITH DIFFERENTIAL/PLATELET - Abnormal; Notable for the following components:      Result Value   RBC 3.64 (*)    Hemoglobin 11.7 (*)    HCT 34.8 (*)    All other components within normal limits  BASIC METABOLIC PANEL - Abnormal; Notable  for the following components:   Glucose, Bld 181 (*)    Creatinine, Ser 1.35 (*)    GFR, Estimated 60 (*)    All other components within normal limits  URINALYSIS, ROUTINE W REFLEX MICROSCOPIC - Abnormal; Notable for the following components:   Color, Urine STRAW (*)    APPearance CLEAR (*)    Glucose, UA 150 (*)    Hgb urine dipstick SMALL (*)    Leukocytes,Ua SMALL (*)    All other components within normal limits  URINE CULTURE  BETA-HYDROXYBUTYRIC ACID     EKG     RADIOLOGY I independently reviewed and interpreted imaging and agree with radiologists findings.     PROCEDURES:  Critical Care performed:   Procedures   MEDICATIONS ORDERED IN ED: Medications  sodium chloride 0.9 % bolus 1,000 mL (0 mLs Intravenous Stopped 11/29/21 1458)  ketorolac (TORADOL) 15 MG/ML injection 15 mg (15 mg Intravenous Given 11/29/21 1145)  iohexol (OMNIPAQUE) 300 MG/ML solution 100 mL (100 mLs Intravenous Contrast Given 11/29/21 1357)     IMPRESSION / MDM / ASSESSMENT AND PLAN / ED COURSE  I reviewed the triage vital signs and the nursing notes.   Differential diagnosis includes, but is not limited to, worsening prostatitis, perianal abscess, perirectal abscess, cellulitis.  There are no abnormal findings to the perineum or scrotum to suggest Fournier's gangrene or scrotal abnormality.  He patient does have pain with DRE with fluctuance felt along the rectal wall.  No external evidence of cellulitis.  He is awake and alert, hemodynamically stable and afebrile.  His labs are overall reassuring.  Repeat CT scan was obtained for evaluation of possible abscess given new findings on DRE.  He was treated symptomatically with improvement of his pain while awaiting CT findings.  CT scan reveals stranding about the sphincter complex and edema in a distribution that could be seen in the setting of a early sinus tract formation or perianal fistula.  This was discussed with Dr. Tonna Boehringer with general surgery  who feels that the patient should be seen in his clinic tomorrow.  Patient agrees to do so.  In the meantime, I advised that he continue taking his antibiotics.  We discussed return precautions and the importance of close outpatient follow-up.  He was given a small amount of oxycodone to carry him through until his  appointment tomorrow.  He was advised that this is highly addictive and should only be used for breakthrough pain only.  He was also advised that he cannot drive, operate heavy machinery, or perform any tests or require concentration while taking this medication.  Patient understands and agrees with plan.  He was discharged in stable condition.   Patient's presentation is most consistent with acute complicated illness / injury requiring diagnostic workup.   Clinical Course as of 11/29/21 1932  Mon Nov 29, 2021  1443 Discussed with general surgery, Dr. Tonna Boehringer.  He feels that the patient should see him in the clinic tomorrow, has advised that the patient call his clinic to schedule an appointment.  Patient agrees with this plan.  He has requested pain medication in the meantime to take tonight. [JP]    Clinical Course User Index [JP] Chaselyn Nanney, Herb Grays, PA-C     FINAL CLINICAL IMPRESSION(S) / ED DIAGNOSES   Final diagnoses:  Pain, rectal     Rx / DC Orders   ED Discharge Orders          Ordered    oxyCODONE (ROXICODONE) 5 MG immediate release tablet  Every 8 hours PRN        11/29/21 1446             Note:  This document was prepared using Dragon voice recognition software and may include unintentional dictation errors.   Keturah Shavers 11/29/21 1932    Minna Antis, MD 11/30/21 1919

## 2021-11-29 NOTE — ED Triage Notes (Signed)
Pt also states his blood sugar has been higher recently.

## 2021-11-29 NOTE — ED Triage Notes (Signed)
Says recent dx prostatitis.  Increasingpain and says he still thinks  he has perianal abscess.

## 2021-11-29 NOTE — Discharge Instructions (Signed)
Please follow-up with Dr. Tonna Boehringer tomorrow.  He is expecting your phone call today to arrange appointment for tomorrow.  Please return for any new, worsening, or change in symptoms or other concerns.  It was a pleasure caring for you today.

## 2021-11-30 LAB — URINE CULTURE: Culture: NO GROWTH

## 2022-05-29 ENCOUNTER — Emergency Department: Payer: Medicaid Other

## 2022-05-29 ENCOUNTER — Emergency Department
Admission: EM | Admit: 2022-05-29 | Discharge: 2022-05-29 | Disposition: A | Discharge status: Home / Self Care | Payer: Medicaid Other | Attending: Emergency Medicine | Admitting: Emergency Medicine

## 2022-05-29 ENCOUNTER — Other Ambulatory Visit: Payer: Self-pay

## 2022-05-29 DIAGNOSIS — E119 Type 2 diabetes mellitus without complications: Secondary | ICD-10-CM | POA: Diagnosis not present

## 2022-05-29 DIAGNOSIS — G20A1 Parkinson's disease without dyskinesia, without mention of fluctuations: Secondary | ICD-10-CM | POA: Insufficient documentation

## 2022-05-29 DIAGNOSIS — B20 Human immunodeficiency virus [HIV] disease: Secondary | ICD-10-CM | POA: Diagnosis not present

## 2022-05-29 DIAGNOSIS — I110 Hypertensive heart disease with heart failure: Secondary | ICD-10-CM | POA: Diagnosis not present

## 2022-05-29 DIAGNOSIS — S0990XA Unspecified injury of head, initial encounter: Secondary | ICD-10-CM | POA: Diagnosis not present

## 2022-05-29 DIAGNOSIS — Z23 Encounter for immunization: Secondary | ICD-10-CM | POA: Insufficient documentation

## 2022-05-29 DIAGNOSIS — E039 Hypothyroidism, unspecified: Secondary | ICD-10-CM | POA: Insufficient documentation

## 2022-05-29 DIAGNOSIS — H5712 Ocular pain, left eye: Secondary | ICD-10-CM | POA: Diagnosis present

## 2022-05-29 DIAGNOSIS — Z8673 Personal history of transient ischemic attack (TIA), and cerebral infarction without residual deficits: Secondary | ICD-10-CM | POA: Diagnosis not present

## 2022-05-29 DIAGNOSIS — I5032 Chronic diastolic (congestive) heart failure: Secondary | ICD-10-CM | POA: Diagnosis not present

## 2022-05-29 MED ORDER — TETANUS-DIPHTH-ACELL PERTUSSIS 5-2.5-18.5 LF-MCG/0.5 IM SUSY
0.5000 mL | PREFILLED_SYRINGE | Freq: Once | INTRAMUSCULAR | Status: AC
  Administered 2022-05-29: 0.5 mL via INTRAMUSCULAR
  Filled 2022-05-29: qty 0.5

## 2022-05-29 NOTE — Discharge Instructions (Signed)
Your CAT scan was normal.  You can take Tylenol Motrin for headache.
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
## 2022-05-29 NOTE — ED Notes (Signed)
[employer] here to speak to patient at this time.

## 2022-08-29 ENCOUNTER — Emergency Department: Payer: Medicaid Other

## 2022-08-29 ENCOUNTER — Other Ambulatory Visit: Payer: Self-pay

## 2022-08-29 ENCOUNTER — Emergency Department
Admission: EM | Admit: 2022-08-29 | Discharge: 2022-08-29 | Disposition: A | Discharge status: Home / Self Care | Payer: Medicaid Other | Attending: Emergency Medicine | Admitting: Emergency Medicine

## 2022-08-29 DIAGNOSIS — Z21 Asymptomatic human immunodeficiency virus [HIV] infection status: Secondary | ICD-10-CM | POA: Diagnosis not present

## 2022-08-29 DIAGNOSIS — W19XXXA Unspecified fall, initial encounter: Secondary | ICD-10-CM | POA: Diagnosis not present

## 2022-08-29 DIAGNOSIS — S2242XA Multiple fractures of ribs, left side, initial encounter for closed fracture: Secondary | ICD-10-CM | POA: Diagnosis not present

## 2022-08-29 DIAGNOSIS — I509 Heart failure, unspecified: Secondary | ICD-10-CM | POA: Insufficient documentation

## 2022-08-29 DIAGNOSIS — G20C Parkinsonism, unspecified: Secondary | ICD-10-CM | POA: Insufficient documentation

## 2022-08-29 DIAGNOSIS — E039 Hypothyroidism, unspecified: Secondary | ICD-10-CM | POA: Insufficient documentation

## 2022-08-29 DIAGNOSIS — S299XXA Unspecified injury of thorax, initial encounter: Secondary | ICD-10-CM | POA: Diagnosis present

## 2022-08-29 DIAGNOSIS — E11649 Type 2 diabetes mellitus with hypoglycemia without coma: Secondary | ICD-10-CM | POA: Insufficient documentation

## 2022-08-29 DIAGNOSIS — E162 Hypoglycemia, unspecified: Secondary | ICD-10-CM

## 2022-08-29 LAB — COMPREHENSIVE METABOLIC PANEL
ALT: 9 U/L (ref 0–44)
AST: 55 U/L — ABNORMAL HIGH (ref 15–41)
Albumin: 3.5 g/dL (ref 3.5–5.0)
Alkaline Phosphatase: 134 U/L — ABNORMAL HIGH (ref 38–126)
Anion gap: 7 (ref 5–15)
BUN: 19 mg/dL (ref 8–23)
CO2: 26 mmol/L (ref 22–32)
Calcium: 8.6 mg/dL — ABNORMAL LOW (ref 8.9–10.3)
Chloride: 101 mmol/L (ref 98–111)
Creatinine, Ser: 1.03 mg/dL (ref 0.61–1.24)
GFR, Estimated: >60 mL/min (ref >60)
Glucose, Bld: 127 mg/dL — ABNORMAL HIGH (ref 70–99)
Potassium: 4.3 mmol/L (ref 3.5–5.1)
Sodium: 134 mmol/L — ABNORMAL LOW (ref 135–145)
Total Bilirubin: 0.8 mg/dL (ref 0.3–1.2)
Total Protein: 6.9 g/dL (ref 6.5–8.1)

## 2022-08-29 LAB — CBC WITH DIFFERENTIAL/PLATELET
Abs Immature Granulocytes: 0.01 10*3/uL (ref 0.00–0.07)
Basophils Absolute: 0 10*3/uL (ref 0.0–0.1)
Basophils Relative: 1 %
Eosinophils Absolute: 0.1 10*3/uL (ref 0.0–0.5)
Eosinophils Relative: 3 %
HCT: 35.2 % — ABNORMAL LOW (ref 39.0–52.0)
Hemoglobin: 11.9 g/dL — ABNORMAL LOW (ref 13.0–17.0)
Immature Granulocytes: 0 %
Lymphocytes Relative: 26 %
Lymphs Abs: 0.9 10*3/uL (ref 0.7–4.0)
MCH: 32.2 pg (ref 26.0–34.0)
MCHC: 33.8 g/dL (ref 30.0–36.0)
MCV: 95.1 fL (ref 80.0–100.0)
Monocytes Absolute: 0.3 10*3/uL (ref 0.1–1.0)
Monocytes Relative: 8 %
Neutro Abs: 2.2 10*3/uL (ref 1.7–7.7)
Neutrophils Relative %: 62 %
Platelets: 168 10*3/uL (ref 150–400)
RBC: 3.7 MIL/uL — ABNORMAL LOW (ref 4.22–5.81)
RDW: 12.6 % (ref 11.5–15.5)
WBC: 3.5 10*3/uL — ABNORMAL LOW (ref 4.0–10.5)
nRBC: 0 % (ref 0.0–0.2)

## 2022-08-29 LAB — URINALYSIS, W/ REFLEX TO CULTURE (INFECTION SUSPECTED)
Bilirubin Urine: NEGATIVE
Glucose, UA: 150 mg/dL — AB
Ketones, ur: NEGATIVE mg/dL
Leukocytes,Ua: NEGATIVE
Nitrite: NEGATIVE
Protein, ur: 30 mg/dL — AB
Specific Gravity, Urine: 1.012 (ref 1.005–1.030)
Squamous Epithelial / HPF: NONE SEEN /HPF (ref 0–5)
pH: 6 (ref 5.0–8.0)

## 2022-08-29 LAB — TROPONIN I (HIGH SENSITIVITY): Troponin I (High Sensitivity): 6 ng/L (ref <18)

## 2022-08-29 LAB — CBG MONITORING, ED
Glucose-Capillary: 24 mg/dL — CL (ref 70–99)
Glucose-Capillary: 91 mg/dL (ref 70–99)
Glucose-Capillary: 83 mg/dL (ref 70–99)

## 2022-08-29 LAB — BRAIN NATRIURETIC PEPTIDE: B Natriuretic Peptide: 22.7 pg/mL (ref 0.0–100.0)

## 2022-08-29 MED ORDER — ONDANSETRON HCL 4 MG/2ML IJ SOLN
4.0000 mg | Freq: Once | INTRAMUSCULAR | Status: AC
  Administered 2022-08-29: 4 mg via INTRAVENOUS
  Filled 2022-08-29: qty 2

## 2022-08-29 MED ORDER — IOHEXOL 300 MG/ML  SOLN
100.0000 mL | Freq: Once | INTRAMUSCULAR | Status: AC | PRN
  Administered 2022-08-29: 100 mL via INTRAVENOUS

## 2022-08-29 MED ORDER — MORPHINE SULFATE (PF) 4 MG/ML IV SOLN
4.0000 mg | Freq: Once | INTRAVENOUS | Status: AC
  Administered 2022-08-29: 4 mg via INTRAVENOUS
  Filled 2022-08-29: qty 1

## 2022-08-29 MED ORDER — OXYCODONE-ACETAMINOPHEN 10-325 MG PO TABS: 1.0000 | ORAL_TABLET | Freq: Four times a day (QID) | ORAL | 0 refills | Status: AC | PRN

## 2022-08-29 NOTE — ED Notes (Signed)
Pt given IS and educated about how to use it and how often and why. Pt agreeable to practice deep breathing regularly with IS at home.

## 2022-08-29 NOTE — ED Triage Notes (Signed)
Pt to ED for fall last Thursday on left side. Reports pain to left ribs with pain on inspiration.

## 2022-08-29 NOTE — ED Notes (Signed)
Provider SF to bedside.

## 2022-08-29 NOTE — ED Notes (Signed)
Pt given ER sandwich tray and water.

## 2022-08-29 NOTE — ED Notes (Addendum)
No wounds currently noted to pt's head/face; pt A&Ox4; pt breathing somewhat shallow and more frequent due to pain at ribs. Pt's skin mildly diaphoretic.

## 2022-08-29 NOTE — ED Notes (Signed)
Pt finished pack of gram crackers, 1 peanut butter packet, OJ x1; pt given another 2 OJ. Pt mildly diaphoretic.

## 2022-08-29 NOTE — ED Provider Notes (Signed)
[facility] Provider Note    Event Date/Time   First MD Initiated Contact with Patient 08/29/22 1004     (approximate)   History   Fall   HPI  Jeffrey Pearson is a 62 y.o. male with history of Parkinson's, hypothyroidism, diabetes, CHF, TIA, HIV presents emergency department after a fall on Thursday.  Patient states he has noticed difficulty breathing in the last 2 days and got much worse today.  Severe back left rib pain.  States when he was standing on a bed to clean ceiling fans he fell landing on the nightstand on the back left rib.  Has had severe pain since then.  Has not noticed blood in his urine.  Just noticed a lot of difficulty breathing when he got up today.      Physical Exam   Triage Vital Signs: ED Triage Vitals  Enc Vitals Group     BP 08/29/22 0945 (!) 151/82     Pulse Rate 08/29/22 0945 97     Resp 08/29/22 0945 16     Temp 08/29/22 0945 99 F (37.2 C)     Temp Source 08/29/22 0945 Oral     SpO2 08/29/22 0945 98 %     Weight 08/29/22 0941 214 lb (97.1 kg)     Height 08/29/22 0941 5' 9.5" (1.765 m)     Head Circumference --      Peak Flow --      Pain Score 08/29/22 0941 10     Pain Loc --      Pain Edu? --      Excl. in GC? --     Most recent vital signs: Vitals:   08/29/22 0945 08/29/22 1311  BP: (!) 151/82 123/66  Pulse: 97 82  Resp: 16 14  Temp: 99 F (37.2 C)   SpO2: 98% 97%     General: Awake, no distress.   CV:  Good peripheral perfusion. regular rate and  rhythm Resp:  Increased respiratory effort. Lungs cta, left posterior ribs tender to palpation Abd:  No distention.  Nontender in the right upper quadrant, mild tenderness at the left Other:     ED Results / Procedures / Treatments   Labs (all labs ordered are listed, but only abnormal results are displayed) Labs Reviewed  COMPREHENSIVE METABOLIC PANEL - Abnormal; Notable for the following components:      Result Value   Sodium 134 (*)     Glucose, Bld 127 (*)    Calcium 8.6 (*)    AST 55 (*)    Alkaline Phosphatase 134 (*)    All other components within normal limits  CBC WITH DIFFERENTIAL/PLATELET - Abnormal; Notable for the following components:   WBC 3.5 (*)    RBC 3.70 (*)    Hemoglobin 11.9 (*)    HCT 35.2 (*)    All other components within normal limits  URINALYSIS, W/ REFLEX TO CULTURE (INFECTION SUSPECTED) - Abnormal; Notable for the following components:   Color, Urine YELLOW (*)    APPearance CLEAR (*)    Glucose, UA 150 (*)    Hgb urine dipstick SMALL (*)    Protein, ur 30 (*)    Bacteria, UA RARE (*)    All other components within normal limits  CBG MONITORING, ED - Abnormal; Notable for the following components:   Glucose-Capillary 24 (*)    All other components within normal limits  BRAIN NATRIURETIC PEPTIDE  TROPONIN I (HIGH SENSITIVITY)  EKG  EKG   RADIOLOGY Left ribs and chest    PROCEDURES:   .Critical Care E&M  Performed by: Faythe Ghee, PA-C Critical care provider statement:    Critical care time (minutes):  30   Critical care time was exclusive of:  Separately billable procedures and treating other patients   Critical care was necessary to treat or prevent imminent or life-threatening deterioration of the following conditions:  Endocrine crisis   Critical care was time spent personally by me on the following activities:  Blood draw for specimens, development of treatment plan with patient or surrogate, examination of patient, evaluation of patient's response to treatment, obtaining history from patient or surrogate, ordering and performing treatments and interventions, ordering and review of laboratory studies, ordering and review of radiographic studies and review of old charts After initial E/M assessment, critical care services were subsequently performed that were exclusive of separately billable procedures or treatment.      MEDICATIONS ORDERED IN  ED: Medications  morphine (PF) 4 MG/ML injection 4 mg (4 mg Intravenous Given 08/29/22 1106)  ondansetron (ZOFRAN) injection 4 mg (4 mg Intravenous Given 08/29/22 1104)  iohexol (OMNIPAQUE) 300 MG/ML solution 100 mL (100 mLs Intravenous Contrast Given 08/29/22 1246)     IMPRESSION / MDM / ASSESSMENT AND PLAN / ED COURSE  I reviewed the triage vital signs and the nursing notes.                              Differential diagnosis includes, but is not limited to, rib fracture, pneumothorax, cardiac contusion, CHF, splenic laceration, kidney injury, CAP  Patient's presentation is most consistent with acute presentation with potential threat to life or bodily function.   The patient does appear to be a little short of breath when talking.  EKG showed a heart rate of 107.  Patient has a temp of 99 patient usually runs in the low 98s   Left ribs and chest ordered from triage  Evaluation of the patient I do feel that we need to do lab work due to the shortness of breath.  Patient's past medical history also would indicate some lab work.  Will most likely end up doing chest CT  I did review the patient's old charts, his labs are reassuring except there is some hgb in the patients urine.  Other labs are in the patients normal trend, bnp is pending  Due to the hemoglobin in the urine along with fracture of the left rib, will do ct chest/abd/pelvis to assess for other injury ie kidney injury etc  Patient's BNP is reassuring  ----------------------------------------- 1:20 PM on 08/29/2022 -----------------------------------------  Patient's glucometer was reading 40, send nursing staff to do CBG while I got him orange juice and peanut butter crackers.  Her CBG is critical at 24 but patient is still alert and is tolerating orange juice.  Nursing staff gave him 2 more containers of orange juice and will recheck CBG shortly.  CT of the chest abdomen pelvis, I did independently review the radiologist  report, did review the images also, I interpret this is being positive for rib fractures with pulmonary contusion and a small effusion, no other acute abnormality  I did discuss this finding with the patient.  Due to the critical glucose reading we will observe the patient for about another hour to make sure his glucose levels remain stable.  If they remain stable will discharge to home with  a incentive spirometer and pain medication.  I did send Percocet to the pharmacy for the patient.  Patient's glucose remained stable.  Discharged in stable condition with a spirometer and pain medication.  Instructions to return if worsening  FINAL CLINICAL IMPRESSION(S) / ED DIAGNOSES   Final diagnoses:  Multiple fractures of ribs, left side, initial encounter for closed fracture  Fall, initial encounter  Hypoglycemia     Rx / DC Orders   ED Discharge Orders          Ordered    oxyCODONE-acetaminophen (PERCOCET) 10-325 MG tablet  Every 6 hours PRN        08/29/22 1346             Note:  This document was prepared using Dragon voice recognition software and may include unintentional dictation errors.    Faythe Ghee, PA-C 08/29/22 1508    Minna Antis, MD 08/29/22 1850

## 2022-08-29 NOTE — Discharge Instructions (Addendum)
Follow-up with your regular doctor.  Please call for an appointment Apply ice to the left ribs. Percocet for pain of the left ribs.  You may also take ibuprofen if needed Return emergency department if difficulty breathing or worsening

## 2022-08-29 NOTE — ED Notes (Addendum)
Attempted for 20g at R lower fa; pt has dexcom in L upper arm.

## 2022-08-29 NOTE — ED Notes (Signed)
Pt reported to [facility] PA that his monitor is reading his BG at 40. Pt given OJ, gram crackers and peanut butter by [facility] PA as this RN was in another pt's room. Pt states is used to low BG as happens often for him. Pt's BG checked again while he continues to eat; pt finished OJ; BG read 24 on hospital glucometer. Pt A&Ox4.

## 2022-08-29 NOTE — ED Notes (Signed)
Attempted for 22g a R mid fa.
